# Patient Record
Sex: Female | Born: 1950 | Race: White | Hispanic: No | Smoking: Former smoker
Health system: Southern US, Community
[De-identification: ages and names within clinical notes are randomized; demographics above are authoritative.]

## PROBLEM LIST (undated history)

## (undated) DIAGNOSIS — F419 Anxiety disorder, unspecified: Secondary | ICD-10-CM

## (undated) DIAGNOSIS — M1990 Unspecified osteoarthritis, unspecified site: Secondary | ICD-10-CM

## (undated) DIAGNOSIS — R51 Headache: Secondary | ICD-10-CM

## (undated) DIAGNOSIS — K219 Gastro-esophageal reflux disease without esophagitis: Secondary | ICD-10-CM

## (undated) DIAGNOSIS — D649 Anemia, unspecified: Secondary | ICD-10-CM

## (undated) DIAGNOSIS — Z8719 Personal history of other diseases of the digestive system: Secondary | ICD-10-CM

## (undated) DIAGNOSIS — J40 Bronchitis, not specified as acute or chronic: Secondary | ICD-10-CM

## (undated) DIAGNOSIS — R32 Unspecified urinary incontinence: Secondary | ICD-10-CM

## (undated) DIAGNOSIS — J189 Pneumonia, unspecified organism: Secondary | ICD-10-CM

## (undated) DIAGNOSIS — N189 Chronic kidney disease, unspecified: Secondary | ICD-10-CM

## (undated) HISTORY — PX: ABDOMINAL HYSTERECTOMY: SHX81

## (undated) HISTORY — PX: ANKLE SURGERY: SHX546

## (undated) HISTORY — PX: WRIST ARTHROSCOPY: SUR100

## (undated) HISTORY — PX: BUNIONECTOMY: SHX129

## (undated) HISTORY — PX: FRACTURE SURGERY: SHX138

## (undated) HISTORY — PX: TUBAL LIGATION: SHX77

## (undated) HISTORY — PX: HIP ARTHROPLASTY: SHX981

## (undated) HISTORY — PX: APPENDECTOMY: SHX54

---

## 1999-04-27 ENCOUNTER — Encounter: Payer: Self-pay | Admitting: Family Medicine

## 1999-04-27 ENCOUNTER — Encounter: Admission: RE | Admit: 1999-04-27 | Discharge: 1999-04-27 | Payer: Self-pay | Admitting: Family Medicine

## 2000-07-04 ENCOUNTER — Encounter: Payer: Self-pay | Admitting: Family Medicine

## 2000-07-04 ENCOUNTER — Encounter: Admission: RE | Admit: 2000-07-04 | Discharge: 2000-07-04 | Payer: Self-pay | Admitting: Family Medicine

## 2002-03-16 ENCOUNTER — Encounter: Payer: Self-pay | Admitting: Family Medicine

## 2002-03-16 ENCOUNTER — Encounter: Admission: RE | Admit: 2002-03-16 | Discharge: 2002-03-16 | Payer: Self-pay | Admitting: Family Medicine

## 2002-03-30 ENCOUNTER — Encounter: Payer: Self-pay | Admitting: Family Medicine

## 2002-03-30 ENCOUNTER — Encounter: Admission: RE | Admit: 2002-03-30 | Discharge: 2002-03-30 | Payer: Self-pay | Admitting: Family Medicine

## 2002-05-13 ENCOUNTER — Encounter: Payer: Self-pay | Admitting: Family Medicine

## 2002-05-13 ENCOUNTER — Encounter: Admission: RE | Admit: 2002-05-13 | Discharge: 2002-05-13 | Payer: Self-pay | Admitting: Family Medicine

## 2005-01-28 ENCOUNTER — Encounter: Admission: RE | Admit: 2005-01-28 | Discharge: 2005-01-28 | Payer: Self-pay | Admitting: Occupational Medicine

## 2005-12-26 ENCOUNTER — Encounter: Admission: RE | Admit: 2005-12-26 | Discharge: 2005-12-26 | Payer: Self-pay | Admitting: Family Medicine

## 2006-01-25 ENCOUNTER — Encounter: Admission: RE | Admit: 2006-01-25 | Discharge: 2006-01-25 | Payer: Self-pay | Admitting: Orthopedic Surgery

## 2006-04-14 ENCOUNTER — Inpatient Hospital Stay (HOSPITAL_COMMUNITY): Admission: EM | Admit: 2006-04-14 | Discharge: 2006-04-17 | Payer: Self-pay | Admitting: Emergency Medicine

## 2007-01-28 ENCOUNTER — Encounter: Admission: RE | Admit: 2007-01-28 | Discharge: 2007-01-28 | Payer: Self-pay | Admitting: Family Medicine

## 2007-02-13 ENCOUNTER — Inpatient Hospital Stay (HOSPITAL_COMMUNITY): Admission: AC | Admit: 2007-02-13 | Discharge: 2007-02-17 | Payer: Self-pay

## 2007-02-13 ENCOUNTER — Ambulatory Visit: Admission: RE | Admit: 2007-02-13 | Discharge: 2007-02-13 | Payer: Self-pay | Admitting: Orthopaedic Surgery

## 2007-02-16 ENCOUNTER — Ambulatory Visit: Payer: Self-pay | Admitting: Physical Medicine & Rehabilitation

## 2007-02-17 ENCOUNTER — Ambulatory Visit: Payer: Self-pay | Admitting: Physical Medicine & Rehabilitation

## 2007-02-17 ENCOUNTER — Inpatient Hospital Stay (HOSPITAL_COMMUNITY)
Admission: RE | Admit: 2007-02-17 | Discharge: 2007-02-27 | Payer: Self-pay | Admitting: Physical Medicine & Rehabilitation

## 2007-07-22 ENCOUNTER — Encounter: Admission: RE | Admit: 2007-07-22 | Discharge: 2007-07-22 | Payer: Self-pay | Admitting: Family Medicine

## 2007-10-17 IMAGING — CR DG CHEST 1V PORT
1 series · 1 of 1 positions shown · non-contrast
Comparison: 01/28/05.

CLINICAL DATA: Preop respiratory exam.  Right ankle fracture.
 PORTABLE CHEST - 1 VIEW, 04/14/06:

[view not recorded]
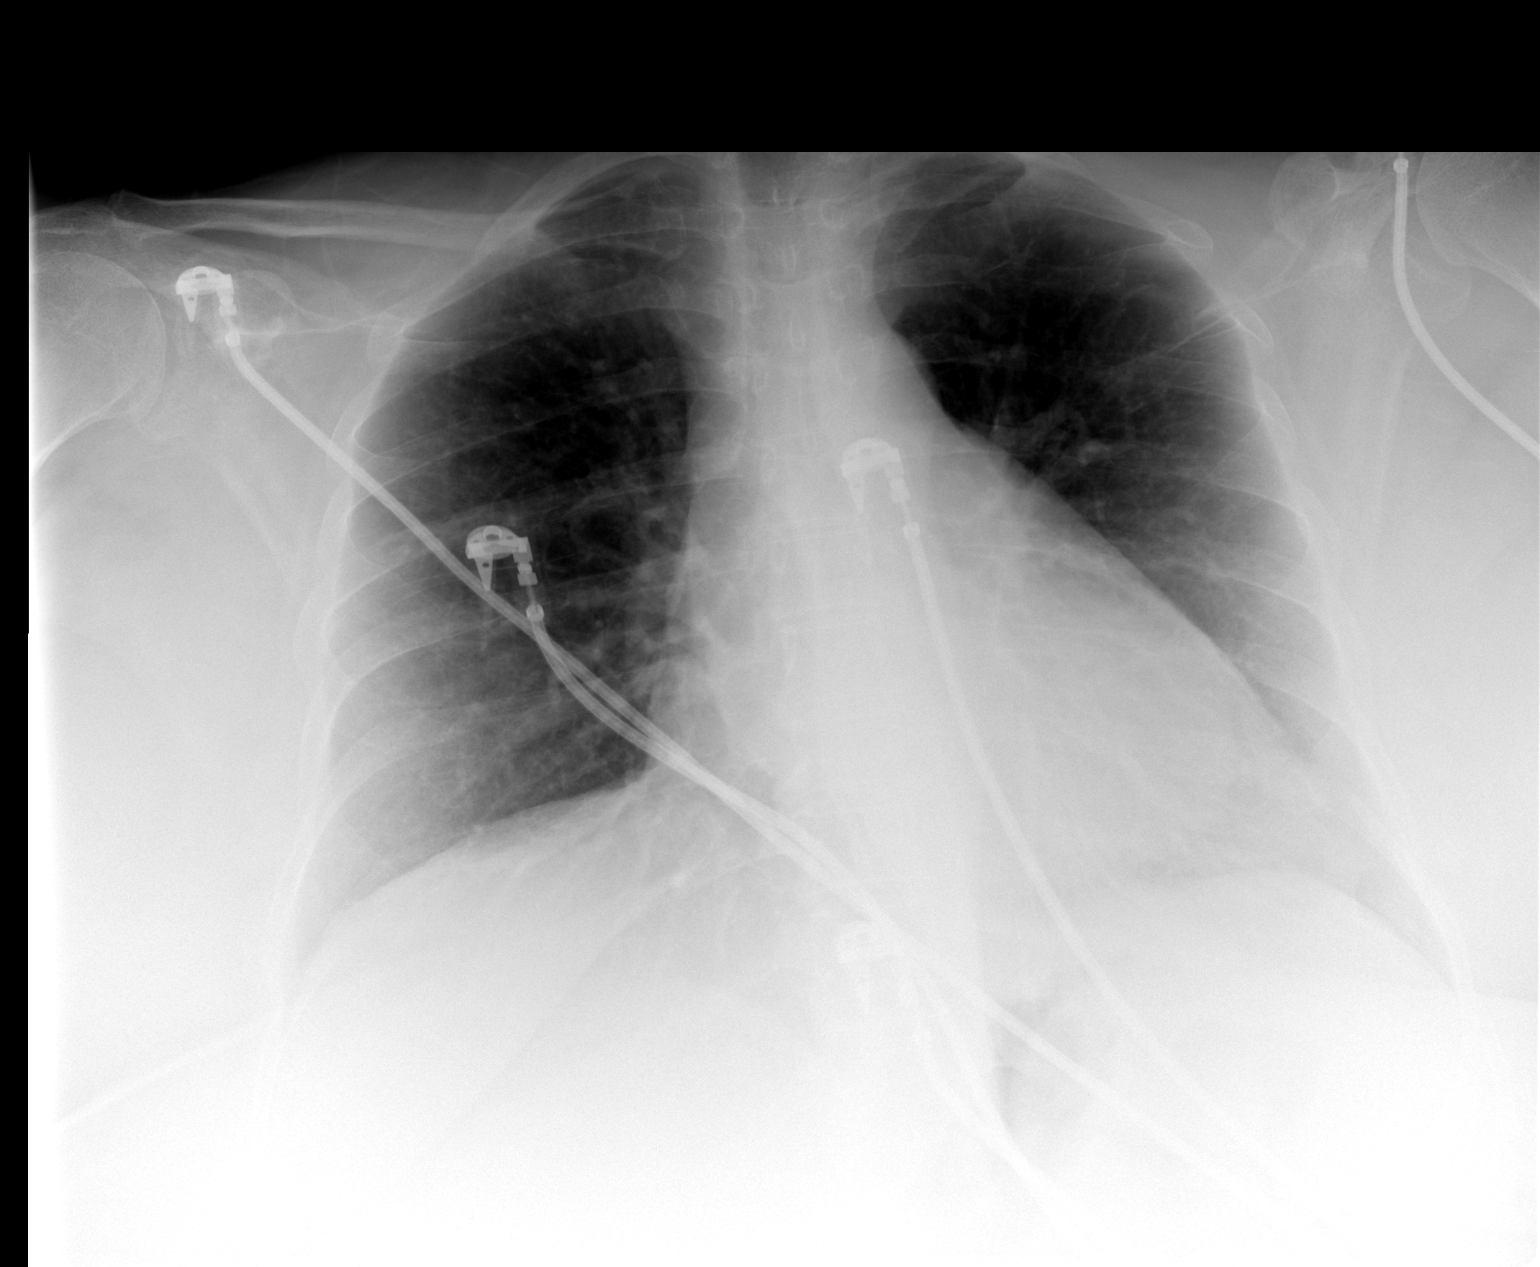

[1 of 1 positions shown; findings below may reference images not displayed]

FINDINGS: Heart size and vascularity are normal and the lungs are clear.  No bony abnormality.
IMPRESSION: Normal chest.

## 2007-10-17 IMAGING — RF DG ANKLE COMPLETE 3+V*R*
1 series · 3 of 3 positions shown · non-contrast
Comparison: Earlier the same day

RIGHT ANKLE - 2 VIEW:

CLINICAL DATA: Right ankle fracture

[Series 1: run · 3 of 3 slices shown]
[im 1/3]
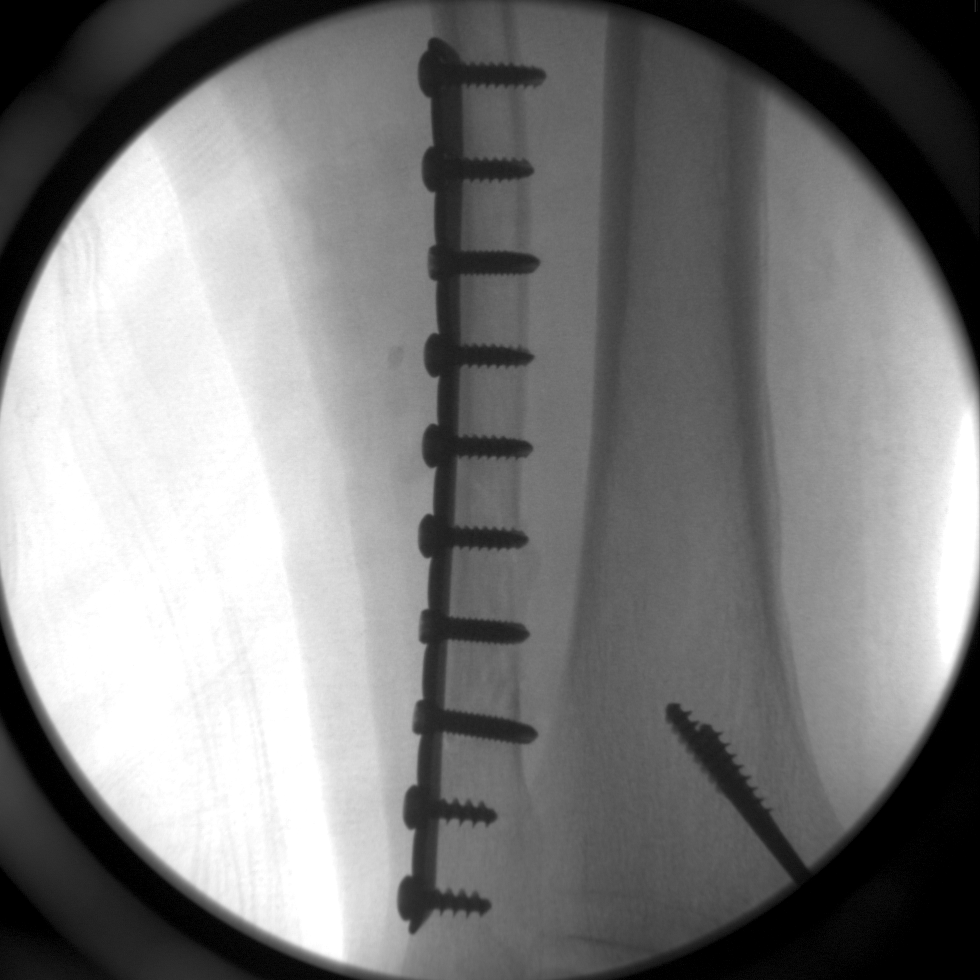
[im 2/3]
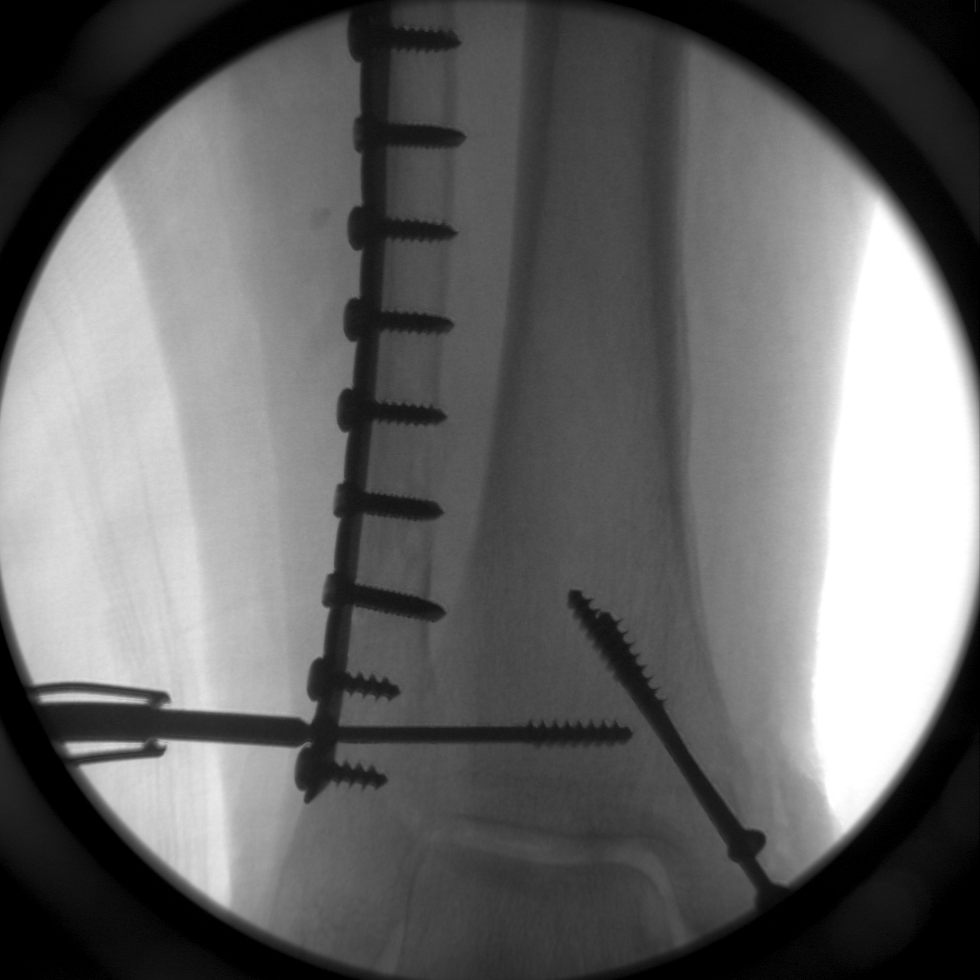
[im 3/3]
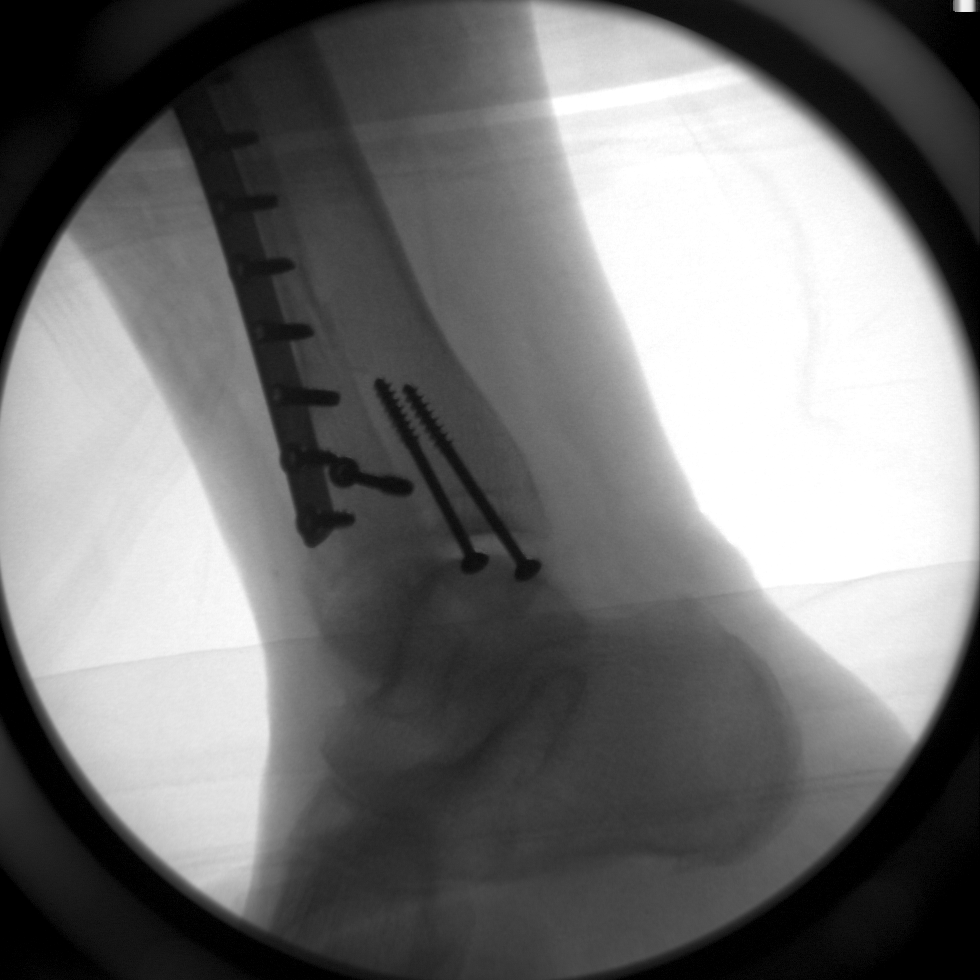

[3 of 3 positions shown; findings below may reference images not displayed]

FINDINGS: Three intraoperative spot Fluoro images are submitted after the
procedure. Patient has undergone compression plate and screw fixation for distal
fibula fracture with marked improvement in bony alignment. Two compression
screws transfix the medial malleolus fracture and a single syndesmotic
compression screw is noted. Alignment at the ankle mortise is substantially
improved compared to the prereduction study.
IMPRESSION: Intraoperative assessment during ORIF for trimalleolar ankle fracture.

## 2008-03-24 ENCOUNTER — Encounter: Admission: RE | Admit: 2008-03-24 | Discharge: 2008-03-24 | Payer: Self-pay | Admitting: Family Medicine
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
## 2008-08-31 ENCOUNTER — Inpatient Hospital Stay (HOSPITAL_COMMUNITY): Admission: RE | Admit: 2008-08-31 | Discharge: 2008-09-05 | Payer: Self-pay | Admitting: Orthopaedic Surgery

## 2008-10-20 ENCOUNTER — Encounter: Admission: RE | Admit: 2008-10-20 | Discharge: 2008-10-20 | Payer: Self-pay | Admitting: Family Medicine

## 2009-03-31 ENCOUNTER — Encounter: Admission: RE | Admit: 2009-03-31 | Discharge: 2009-03-31 | Payer: Self-pay | Admitting: Family Medicine

## 2010-02-09 ENCOUNTER — Encounter: Admission: RE | Admit: 2010-02-09 | Discharge: 2010-02-09 | Payer: Self-pay | Admitting: Orthopaedic Surgery

## 2010-07-24 LAB — URINALYSIS, ROUTINE W REFLEX MICROSCOPIC
Hgb urine dipstick: NEGATIVE
Ketones, ur: NEGATIVE mg/dL
Nitrite: NEGATIVE
Protein, ur: NEGATIVE mg/dL
Urobilinogen, UA: 1 mg/dL (ref 0.0–1.0)

## 2010-07-24 LAB — BASIC METABOLIC PANEL
BUN: 6 mg/dL (ref 6–23)
CO2: 27 mEq/L (ref 19–32)
Calcium: 8.1 mg/dL — ABNORMAL LOW (ref 8.4–10.5)
Creatinine, Ser: 0.7 mg/dL (ref 0.4–1.2)
GFR calc Af Amer: >60 mL/min (ref >60)
GFR calc non Af Amer: 36 mL/min — ABNORMAL LOW (ref >60)
Glucose, Bld: 138 mg/dL — ABNORMAL HIGH (ref 70–99)
Glucose, Bld: 147 mg/dL — ABNORMAL HIGH (ref 70–99)
Potassium: 4.9 mEq/L (ref 3.5–5.1)

## 2010-07-24 LAB — CBC
HCT: 26.7 % — ABNORMAL LOW (ref 36.0–46.0)
HCT: 35.1 % — ABNORMAL LOW (ref 36.0–46.0)
Hemoglobin: 11.9 g/dL — ABNORMAL LOW (ref 12.0–15.0)
Hemoglobin: 9.2 g/dL — ABNORMAL LOW (ref 12.0–15.0)
MCHC: 33.9 g/dL (ref 30.0–36.0)
MCV: 88.2 fL (ref 78.0–100.0)
Platelets: 176 10*3/uL (ref 150–400)
Platelets: 242 10*3/uL (ref 150–400)
RBC: 3.59 MIL/uL — ABNORMAL LOW (ref 3.87–5.11)
RDW: 14.1 % (ref 11.5–15.5)
WBC: 7.1 10*3/uL (ref 4.0–10.5)

## 2010-07-24 LAB — COMPREHENSIVE METABOLIC PANEL
Albumin: 3.6 g/dL (ref 3.5–5.2)
Alkaline Phosphatase: 91 U/L (ref 39–117)
BUN: 11 mg/dL (ref 6–23)
Calcium: 9.1 mg/dL (ref 8.4–10.5)
GFR calc Af Amer: >60 mL/min (ref >60)
GFR calc non Af Amer: >60 mL/min (ref >60)
Sodium: 143 mEq/L (ref 135–145)
Total Protein: 6.6 g/dL (ref 6.0–8.3)

## 2010-07-24 LAB — PROTIME-INR
INR: 0.9 (ref 0.00–1.49)
INR: 1.1 (ref 0.00–1.49)
INR: 1.4 (ref 0.00–1.49)
INR: 2 — ABNORMAL HIGH (ref 0.00–1.49)
INR: 2 — ABNORMAL HIGH (ref 0.00–1.49)
Prothrombin Time: 17.7 seconds — ABNORMAL HIGH (ref 11.6–15.2)
Prothrombin Time: 24.3 seconds — ABNORMAL HIGH (ref 11.6–15.2)

## 2010-07-24 LAB — DIFFERENTIAL
Eosinophils Relative: 6 % — ABNORMAL HIGH (ref 0–5)
Lymphs Abs: 1.8 10*3/uL (ref 0.7–4.0)
Neutrophils Relative %: 59 % (ref 43–77)

## 2010-07-24 LAB — URINE MICROSCOPIC-ADD ON
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
## 2010-08-28 NOTE — Op Note (Signed)
[name], [name]               ACCOUNT NO.:  [id number]   MEDICAL RECORD NO.:  [id number]          PATIENT TYPE:  INP   LOCATION:  5036                         FACILITY:  [facility]   PHYSICIAN:  Mark C. Ophelia Charter, M.D.    DATE OF BIRTH:  03/17/1951   DATE OF PROCEDURE:  08/31/2008  DATE OF DISCHARGE:                               OPERATIVE REPORT   PREOPERATIVE DIAGNOSES:  Right hip osteoarthritis.   POSTOPERATIVE DIAGNOSIS:  Right hip osteoarthritis.   PROCEDURE:  Right total hip arthroplasty.   SURGEON:  Mark C. Ophelia Charter, MD   ASSISTANT:  Maud Deed, PA-C   ANESTHESIA:  GOT plus Marcaine skin local.   DRAINS:  None.   This 60 year old female has had progressive hip osteoarthritis, good  response to single intra-articular injection under fluoro with pain  relief.  She has loss of joint space, flattening of head, large marginal  osteophytes, and subchondral cyst formation.  Leg lengths were equal.  She has failed conservative treatment and has progressed to the point  where she is ambulating with a cane and a walker and has pain with every  step.   After induction of general anesthesia, orotracheal intubation, the  patient was placed in the lateral position.  After Foley placement and  hip was prepped, 2 g of Ancef was given prophylactically.  ChloraPrep  was used due to the patient's questionable IODINE ALLERGY.  Once it was  dry, Betadine Vi-Drape was applied.  This was decided with the patient's  borderline skin allergy and the fact that the decreased bacterial count  with the drape decrease the patient's infection right.  At the end of  the case, all sterile drapes was completely removed from the patient.   Usual impervious stockinette, Coban, and split sheets drapes sticky U  drape, impervious and regular U drapes were applied with Betadine Vi-  drapes in the skin, after sterile skin marker was used.  Posterior  approach was made and slow dissection was performed using  the Bovie  electrocautery going through the thick layer of adipose tissue, the  patient was 300+ pounds and most of that was in the region of the  incision and dissection.  Once the fascia was finally encountered, the  Charnley with extra deep blade had to be used just in order to visualize  the fascia.  Fascia was opened.  Gluteus maximus was split in line with  its fibers.  The Charnley was able to just barely catch the gluteus  medius muscle.  Piriformis was tagged and cut.  Nerve was palpated,  posterior capsule was opened, osteophytes were removed off the  acetabular side after the neck was cut with an oscillating saw 1  fingerbreadth above the lesser trochanter.  Canal started reamer,  sequential broaching reaming was performed up to #5 with excellent fill  and fit with a #5.  Prosthesis was still proud by about 2 mm that gave  extremely tight fit anterior-posterior.  There was concern that full  seating might crack the calcar in this very large woman.  Marney Doctor was  removed.  Canal was  irrigated,  a 4 x 18 sponge was placed down the  canal after irrigation, sharp Hohmann anterior and sharp Cobra inferior-  posterior on the acetabulum, labrum was completely removed.  Anterior  osteophytes were removed.  Sequential reaming, which was difficult with  the neck partially in the way.  Once the anterior osteophytes were  removed, the anterior capsule was looser allowing the femur to retract  better out of the way, sequential reaming up to 52 cup planned.  The 51  reamer was last reamer, this was reamed down to the base of the fovea.  Cup was set with corner of the room in 45 degrees abduction selected.  There was slight lateral overhanging after removal of the osteophytes,  posterior portion was flushed and there was 6-7 mm noncoverage  anteriorly.  About 45 degrees of abduction and 25 degrees of anteversion  were placed.  After trials permanent cup was inserted, central hole  filled and  the two-piece all metal cup liner was inserted, DePuy metal-  on-metal.  Final #5 porous-coated stem was placed followed by the +1.5  36-mm ball, high offset neck was selected and there was difficulty  getting the hip reduced.  There was no shuck, no tightness of the quad,  flexion 90 degrees, internal rotation 85 degrees with no dislocation.  No impingement anteriorly.  No impingement with the trochanter with  abduction.  After irrigation with saline solution, piriformis repaired  back to gluteus medius, which was tight with the high offset.  Tensor  fascia was tight and was repaired side-to-side with interrupted #1  Ethibond or Tycron sutures.  0-Vicryl in fatty layers followed by 2-0  Vicryl in fatty layers, 2 on the subcutaneous tissue, and then  subcuticular skin closure.  Tincture of Benzoin, Steri-Strips were  applied.  Postop dressing, knee immobilizer and transferred to recovery  room in stable condition.  Instrument count and needle count was  correct.        Mark C. Ophelia Charter, M.D.  Electronically Signed     MCY/MEDQ  D:  08/31/2008  T:  09/01/2008  Job:  [id number]
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
## 2010-08-28 NOTE — Discharge Summary (Signed)
[name], [name]               ACCOUNT NO.:  [id number]   MEDICAL RECORD NO.:  [id number]          PATIENT TYPE:  INP   LOCATION:  5036                         FACILITY:  [facility]   PHYSICIAN:  Mark C. Ophelia Charter, M.D.    DATE OF BIRTH:  1951/02/05   DATE OF ADMISSION:  08/31/2008  DATE OF DISCHARGE:  09/05/2008                               DISCHARGE SUMMARY   ADMISSION DIAGNOSES:  1. Osteoarthritis of the right hip.  2. Depression.  3. Gastroesophageal reflux disease.  4. Asthma.  5. Peripheral edema.  6. Hypertension.   DISCHARGE DIAGNOSES:  1. Osteoarthritis of the right hip.  2. Depression.  3. Gastroesophageal reflux disease.  4. Asthma.  5. Peripheral edema.  6. Hypertension.  7. Posthemorrhagic anemia.  8. Postoperative hypovolemia, resolved.  9. Obesity.   PROCEDURE:  On Aug 31, 2008, the patient underwent right total hip  arthroplasty performed by Dr. Ophelia Charter, assisted by Maud Deed, PA-C  under general anesthesia.   CONSULTATIONS:  None.   BRIEF HISTORY:  The patient is a 60 year old female with progressive  pain in the right hip due to osteoarthritis.  She has undergone intra-  articular injection to the right hip, which gave her relief of her pain,  but this was only temporary.  Radiograph show loss of joint space  flattening of the femoral head, large marginal osteophytes and  subchondral cyst formation.  It was felt that she would benefit from  surgical intervention and was admitted for the procedure as stated  above.   BRIEF HOSPITAL COURSE:  The patient tolerated the procedure under  general anesthesia without complications.  Postoperatively,  neurovascular motor function of the lower extremities was intact.  Dressing changes were done daily and wound was healing without drainage  or signs of infection through the hospital stay.  The patient did have  hypovolemia on the first postoperative day with oxygen saturations into  the 60s.  She required fluid  boluses, which brought her oxygen  saturations and vital signs back to normal value.  She was started on  physical therapy for ambulation and gait training.  She was instructed  in total hip replacement precautions and was able to demonstrate  adherence to these precautions prior to discharge.  The patient was  treated initially with PCA analgesics and was weaned to p.o. analgesics  without difficulty.  She was started on Coumadin for DVT prophylaxis.  Adjustments in Coumadin dose were made according to daily Protimes by  the pharmacist at the hospital.  With physical therapy, the patient was  able to ambulate as much as 150 feet.  She was independent with  activities of daily living.  She was discharged to her home on Sep 05, 2008.   PERTINENT LABORATORY VALUES:  Admission CBC within normal limits.  Hemoglobin and hematocrit 11.9 and 35.1.  Postoperatively, hemoglobin  and hematocrit dropped to 9.2 and 26.7.  On admission, coagulation  studies within normal limits.  INR at discharge 2.0.  Chemistry studies  on admission within normal limits with exception of glucose 107.  Postoperatively, sodium dropped to 134,  but returned to normal value on  recheck.  Calcium 9.1 on admission and 8.1 postoperatively.  Urinalysis  on admission moderate leukocyte esterase, many epithelials, 21-50 wbc's,  and 0-2 rbc's.  EKG on admission, normal sinus rhythm with no  significant change since last tracing.   PLAN:  The patient will be discharged to her home and receive home  health physical therapy and durable medical equipment.  She will change  her dressing daily or as needed.  She will be allowed to shower and the  wound may be wet if there is no drainage.  She will be weightbearing as  tolerated and use a walker for ambulation.  Ice packs as needed to the  operative extremity.  She will follow up with Dr. Ophelia Charter 2 weeks from the  date of surgery.   PRESCRIPTIONS AT DISCHARGE:  1. Robaxin 500 mg 1  every 6-8 hours as needed for spasm.  2. Tylox 5/325 one to two every 4-6 hours as needed for pain.  3. Coumadin 5 mg once daily or as directed.   She will resume her home medications as taken prior to admission with  the exception of Mobic and was given medication reconciliation form with  these instructions.   She was advised to call should she have questions or concerns prior to  return to office visit.   CONDITION ON DISCHARGE:  Stable.      Wende Neighbors, P.A.      Mark C. Ophelia Charter, M.D.  Electronically Signed    SMV/MEDQ  D:  10/13/2008  T:  10/13/2008  Job:  [id number]

## 2010-08-31 NOTE — Discharge Summary (Signed)
[name], [name]               ACCOUNT NO.:  [id number]   MEDICAL RECORD NO.:  [id number]          PATIENT TYPE:  INP   LOCATION:  5039                         FACILITY:  [facility]   PHYSICIAN:  Mark C. Ophelia Charter, M.D.    DATE OF BIRTH:  20-May-1950   DATE OF ADMISSION:  04/14/2006  DATE OF DISCHARGE:  04/17/2006                               DISCHARGE SUMMARY   FINAL DIAGNOSIS:  Right bimalleolar ankle fracture with stent osmotic  rupture (distal tib/fib ligament rupture).   PROCEDURE:  ORIF medial malleolus and fibular shaft was set with osmotic  screw placement on April 14, 2006.   This 60 year old female fell questionably on some ice, suffering a  trimalleolar ankle fracture with syndesmotic rupture.  He was walking to  his car on his way to work when he slipped and fell.   MEDICATIONS ON ADMISSION:  Include Nexium, Zyrtec, Xanax 0.25 mg p.o.  b.i.d. and iron 300 mg p.o. b.i.d.   X-rays demonstrated trimalleolar ankle fracture.  He was admitted and  taken to the operating room on December 31 at 9 p.m. and underwent  bimalleolar fixation with a fibular plating and syndesmotic screw  placement.  Tourniquet time 37 minutes.  Postoperatively, he was seen by  physical therapy, nonweightbearing.  Three in 1 commode was ordered.  He  was nonweightbearing secondary to syndesmotic screw.  He was placed on a  4-point walker, taking Tylox for pain and was discharged on April 17, 2006, due to slow progress, due to his weight greater than 300 pounds.  X-rays showed excellent position and alignment.  He was asked to  followup 1 week for a postoperative splint check.  Admission labs were  all normal with the exception of an AST of 40 with normal ALT, albumin  and bilirubin.  Glucose was mildly elevated at 117.  Asked to followup  in 1 week.      Mark C. Ophelia Charter, M.D.  Electronically Signed     MCY/MEDQ  D:  05/23/2006  T:  05/24/2006  Job:  [id number]
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
## 2010-10-03 ENCOUNTER — Other Ambulatory Visit: Payer: Self-pay | Admitting: Orthopaedic Surgery

## 2010-10-03 DIAGNOSIS — M79604 Pain in right leg: Secondary | ICD-10-CM

## 2010-10-04 ENCOUNTER — Ambulatory Visit
Admission: RE | Admit: 2010-10-04 | Discharge: 2010-10-04 | Disposition: A | Discharge status: Home / Self Care | Payer: BC Managed Care – PPO | Source: Ambulatory Visit | Attending: Orthopaedic Surgery | Admitting: Orthopaedic Surgery

## 2010-10-04 DIAGNOSIS — M79605 Pain in left leg: Secondary | ICD-10-CM

## 2011-01-22 LAB — CBC
HCT: 22.9 — ABNORMAL LOW
HCT: 23 — ABNORMAL LOW
HCT: 23.5 — ABNORMAL LOW
HCT: 26.9 — ABNORMAL LOW
Hemoglobin: 10.2 — ABNORMAL LOW
Hemoglobin: 7.5 — CL
Hemoglobin: 7.8 — CL
Hemoglobin: 8 — ABNORMAL LOW
MCHC: 32.9
MCHC: 33
MCHC: 33.1
MCHC: 33.6
MCV: 82.9
MCV: 83.3
Platelets: 217
Platelets: 273
Platelets: 309
RBC: 2.73 — ABNORMAL LOW
RBC: 2.92 — ABNORMAL LOW
RBC: 3.45 — ABNORMAL LOW
RBC: 3.5 — ABNORMAL LOW
RDW: 14.8 — ABNORMAL HIGH
RDW: 15 — ABNORMAL HIGH
RDW: 15.3 — ABNORMAL HIGH
RDW: 15.6 — ABNORMAL HIGH
WBC: 5.3
WBC: 6.6
WBC: 7.4
WBC: 8.1

## 2011-01-22 LAB — COMPREHENSIVE METABOLIC PANEL
Albumin: 2.2 — ABNORMAL LOW
BUN: 5 — ABNORMAL LOW
Calcium: 8.4
Chloride: 97
Creatinine, Ser: 0.58
Total Bilirubin: 1.3 — ABNORMAL HIGH

## 2011-01-22 LAB — URINALYSIS, ROUTINE W REFLEX MICROSCOPIC
Glucose, UA: NEGATIVE
Glucose, UA: NEGATIVE
Ketones, ur: NEGATIVE
Protein, ur: NEGATIVE
Specific Gravity, Urine: 1.021
Urobilinogen, UA: 2 — ABNORMAL HIGH
pH: 6

## 2011-01-22 LAB — BASIC METABOLIC PANEL
BUN: 9
CO2: 28
Calcium: 8.9
Chloride: 103
Creatinine, Ser: 1.04
GFR calc Af Amer: >60
GFR calc non Af Amer: 54 — ABNORMAL LOW
Glucose, Bld: 157 — ABNORMAL HIGH
Potassium: 4.6
Sodium: 138

## 2011-01-22 LAB — DIFFERENTIAL
Basophils Absolute: 0
Lymphocytes Relative: 18
Monocytes Absolute: 0.5
Neutro Abs: 3.7

## 2011-01-22 LAB — URINE MICROSCOPIC-ADD ON

## 2011-01-22 LAB — CROSSMATCH: ABO/RH(D): O POS

## 2011-01-22 LAB — URINE CULTURE
Colony Count: >=100000
Special Requests: NEGATIVE

## 2011-01-23 LAB — CBC
HCT: 27.1 — ABNORMAL LOW
Hemoglobin: 9 — ABNORMAL LOW
MCV: 82.1
Platelets: 335
RDW: 14.6 — ABNORMAL HIGH

## 2011-01-23 LAB — I-STAT EC8
Acid-base deficit: 3 — ABNORMAL HIGH
Bicarbonate: 22.9
Glucose, Bld: 165 — ABNORMAL HIGH
TCO2: 24
pCO2 arterial: 42.4
pH, Arterial: 7.341 — ABNORMAL LOW

## 2011-01-23 LAB — POCT I-STAT 4, (NA,K, GLUC, HGB,HCT)
Glucose, Bld: 148 — ABNORMAL HIGH
Glucose, Bld: 168 — ABNORMAL HIGH
HCT: 25 — ABNORMAL LOW
Hemoglobin: 8.5 — ABNORMAL LOW
Operator id: 117071
Potassium: 4.7
Potassium: 5.1
Sodium: 140

## 2011-01-23 LAB — SAMPLE TO BLOOD BANK

## 2011-01-23 LAB — TYPE AND SCREEN: Antibody Screen: NEGATIVE

## 2011-01-23 LAB — POCT I-STAT CREATININE
Creatinine, Ser: 0.7
Operator id: 144801

## 2011-01-23 LAB — ABO/RH: ABO/RH(D): O POS

## 2011-03-15 ENCOUNTER — Other Ambulatory Visit (HOSPITAL_COMMUNITY): Payer: Self-pay | Admitting: Orthopaedic Surgery

## 2011-03-15 DIAGNOSIS — M25551 Pain in right hip: Secondary | ICD-10-CM

## 2011-03-27 ENCOUNTER — Encounter (HOSPITAL_COMMUNITY)
Admission: RE | Admit: 2011-03-27 | Discharge: 2011-03-27 | Disposition: A | Discharge status: Home / Self Care | Payer: BC Managed Care – PPO | Source: Ambulatory Visit | Attending: Orthopaedic Surgery | Admitting: Orthopaedic Surgery

## 2011-03-27 ENCOUNTER — Ambulatory Visit (HOSPITAL_COMMUNITY)
Admission: RE | Admit: 2011-03-27 | Discharge: 2011-03-27 | Disposition: A | Discharge status: Home / Self Care | Payer: BC Managed Care – PPO | Source: Ambulatory Visit | Attending: Orthopaedic Surgery | Admitting: Orthopaedic Surgery

## 2011-03-27 DIAGNOSIS — M25559 Pain in unspecified hip: Secondary | ICD-10-CM | POA: Insufficient documentation

## 2011-03-27 DIAGNOSIS — M25551 Pain in right hip: Secondary | ICD-10-CM

## 2011-03-27 DIAGNOSIS — Z96649 Presence of unspecified artificial hip joint: Secondary | ICD-10-CM | POA: Insufficient documentation

## 2011-03-27 MED ORDER — TECHNETIUM TC 99M MEDRONATE IV KIT
25.0000 | PACK | Freq: Once | INTRAVENOUS | Status: AC | PRN
  Administered 2011-03-27: 25 via INTRAVENOUS

## 2011-04-23 ENCOUNTER — Other Ambulatory Visit (HOSPITAL_COMMUNITY): Payer: Self-pay | Admitting: Orthopedic Surgery

## 2011-04-25 ENCOUNTER — Encounter (HOSPITAL_COMMUNITY)
Admission: RE | Admit: 2011-04-25 | Discharge: 2011-04-25 | Disposition: A | Discharge status: Home / Self Care | Payer: BC Managed Care – PPO | Source: Ambulatory Visit | Attending: Orthopaedic Surgery | Admitting: Orthopaedic Surgery

## 2011-04-25 ENCOUNTER — Encounter (HOSPITAL_COMMUNITY): Payer: Self-pay | Admitting: Respiratory Therapy

## 2011-04-25 ENCOUNTER — Ambulatory Visit (HOSPITAL_COMMUNITY)
Admission: RE | Admit: 2011-04-25 | Discharge: 2011-04-25 | Disposition: A | Discharge status: Home / Self Care | Payer: BC Managed Care – PPO | Source: Ambulatory Visit | Attending: Orthopaedic Surgery | Admitting: Orthopaedic Surgery

## 2011-04-25 ENCOUNTER — Encounter (HOSPITAL_COMMUNITY): Payer: Self-pay

## 2011-04-25 ENCOUNTER — Other Ambulatory Visit: Payer: Self-pay

## 2011-04-25 HISTORY — DX: Headache: R51

## 2011-04-25 HISTORY — DX: Bronchitis, not specified as acute or chronic: J40

## 2011-04-25 HISTORY — DX: Unspecified urinary incontinence: R32

## 2011-04-25 HISTORY — DX: Anxiety disorder, unspecified: F41.9

## 2011-04-25 HISTORY — DX: Anemia, unspecified: D64.9

## 2011-04-25 HISTORY — DX: Pneumonia, unspecified organism: J18.9

## 2011-04-25 HISTORY — DX: Gastro-esophageal reflux disease without esophagitis: K21.9

## 2011-04-25 HISTORY — DX: Unspecified osteoarthritis, unspecified site: M19.90

## 2011-04-25 HISTORY — DX: Personal history of other diseases of the digestive system: Z87.19

## 2011-04-25 HISTORY — DX: Chronic kidney disease, unspecified: N18.9

## 2011-04-25 LAB — URINALYSIS, ROUTINE W REFLEX MICROSCOPIC
Ketones, ur: NEGATIVE mg/dL
Nitrite: POSITIVE — AB
Protein, ur: NEGATIVE mg/dL
pH: 5.5 (ref 5.0–8.0)

## 2011-04-25 LAB — COMPREHENSIVE METABOLIC PANEL
Albumin: 4.2 g/dL (ref 3.5–5.2)
BUN: 15 mg/dL (ref 6–23)
Chloride: 96 mEq/L (ref 96–112)
Creatinine, Ser: 0.9 mg/dL (ref 0.50–1.10)
Total Bilirubin: 0.5 mg/dL (ref 0.3–1.2)
Total Protein: 7.7 g/dL (ref 6.0–8.3)

## 2011-04-25 LAB — CBC
HCT: 42.1 % (ref 36.0–46.0)
MCH: 30.7 pg (ref 26.0–34.0)
MCHC: 33.3 g/dL (ref 30.0–36.0)
MCV: 92.3 fL (ref 78.0–100.0)
RDW: 13.2 % (ref 11.5–15.5)

## 2011-04-25 LAB — PROTIME-INR: INR: 0.95 (ref 0.00–1.49)

## 2011-04-25 LAB — URINE MICROSCOPIC-ADD ON

## 2011-04-25 LAB — DIFFERENTIAL
Basophils Relative: 0 % (ref 0–1)
Monocytes Relative: 7 % (ref 3–12)
Neutro Abs: 8.1 10*3/uL — ABNORMAL HIGH (ref 1.7–7.7)
Neutrophils Relative %: 70 % (ref 43–77)

## 2011-04-25 LAB — ABO/RH: ABO/RH(D): O POS

## 2011-04-25 LAB — TYPE AND SCREEN: ABO/RH(D): O POS

## 2011-04-25 MED ORDER — CEFAZOLIN SODIUM-DEXTROSE 2-3 GM-% IV SOLR
2.0000 g | INTRAVENOUS | Status: AC
  Administered 2011-04-26: 2 g via INTRAVENOUS
  Filled 2011-04-25: qty 50

## 2011-04-25 NOTE — H&P (Signed)
[name] is an 61 y.o. female.   [name] is an 61 y.o. female.   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  She has had increasing pain to the right hip for one year, worse for the past 2 months.  She has had full evaluation including chromium and cobalt levels which are significantly elevated.. (cobalt 21.4 and chromium 4).  As of December 2012, sed rate 32 and CRP of 0.85.  A 3 phase bone scan on 03/27/11 showed no evidence of loosening of the hardware.  Venous doppler study in June 2012 was negative for DVT.  Aspiration of the hip was negative for infection.  It is felt she would benefit from revision of the hip replacement with conversion to a different weight bearing surface.  After risk and benefits discussed with the patient she wishes to proceed with revision of the right THR with exchange of the metal liner to a polyethylene liner.  This will be done by Dr Ophelia Charter.   Past Medical History  Diagnosis Date  . Asthma   . Bronchitis   . Pneumonia   . Anemia   . Chronic kidney disease     urinary tract infection  . GERD (gastroesophageal reflux disease)   . H/O hiatal hernia   . Headache   . Arthritis     osteoarthritis  . Anxiety     xanax, 2 tabs per day  . Incontinence     wears pad    Past Surgical History  Procedure Date  . Appendectomy   . Tubal ligation   . Abdominal hysterectomy     partial  . Bunionectomy     right  . Ankle surgery     with plate on right and left  . Fracture surgery     left wrist fracture repair  with plate  . Hip arthroplasty     right    Family History  Problem Relation Age of Onset  . Anesthesia problems Mother   . Hypotension Father    Social History:  reports that she has quit smoking. Her smoking use included Cigarettes. She does not have any smokeless tobacco history on file. She reports that she drinks alcohol. She reports that she does not  use illicit drugs.  Allergies: No Known Allergies  No current facility-administered medications on file as of .   No current outpatient prescriptions on file as of .    Results for orders placed during the hospital encounter of 04/25/11 (from the past 48 hour(s))  CBC     Status: Abnormal   Collection Time   04/25/11  3:56 PM      Component Value Range Comment   WBC 11.6 (*) 4.0 - 10.5 (K/uL)    RBC 4.56  3.87 - 5.11 (MIL/uL)    Hemoglobin 14.0  12.0 - 15.0 (g/dL)    HCT 08.6  57.8 - 46.9 (%)    MCV 92.3  78.0 - 100.0 (fL)    MCH 30.7  26.0 - 34.0 (pg)    MCHC 33.3  30.0 - 36.0 (g/dL)    RDW 62.9  52.8 - 41.3 (%)    Platelets 313  150 - 400 (K/uL)   DIFFERENTIAL     Status: Abnormal   Collection Time   04/25/11  3:56 PM      Component Value Range Comment   Neutrophils Relative 70  43 -  77 (%)    Neutro Abs 8.1 (*) 1.7 - 7.7 (K/uL)    Lymphocytes Relative 19  12 - 46 (%)    Lymphs Abs 2.2  0.7 - 4.0 (K/uL)    Monocytes Relative 7  3 - 12 (%)    Monocytes Absolute 0.8  0.1 - 1.0 (K/uL)    Eosinophils Relative 4  0 - 5 (%)    Eosinophils Absolute 0.5  0.0 - 0.7 (K/uL)    Basophils Relative 0  0 - 1 (%)    Basophils Absolute 0.0  0.0 - 0.1 (K/uL)    Dg Chest 2 View  04/25/2011  *RADIOLOGY REPORT*  Clinical Data: Preoperative respiratory examination for revision of right total hip arthroplasty.  CHEST - 2 VIEW  Comparison: 08/29/2008 radiographs.  Findings: Detail is limited by body habitus.  Heart size and mediastinal contours are stable.  The lungs are clear.  There is no pleural effusion or pneumothorax.  No acute osseous findings are identified.  IMPRESSION: Stable examination.  No active cardiopulmonary process demonstrated.  Original Report Authenticated By: Gerrianne Scale, M.D.    Review of Systems  Constitutional: Negative.   HENT: Negative.   Eyes: Negative.   Respiratory: Negative.   Cardiovascular: Negative.   Gastrointestinal: Negative.   Genitourinary:  Negative.   Musculoskeletal:       See hpi  Skin: Negative.   Neurological: Negative.   Endo/Heme/Allergies: Negative.     There were no vitals taken for this visit. Physical Exam  Constitutional: She is oriented to person, place, and time. She appears well-developed and well-nourished.  HENT:  Head: Normocephalic and atraumatic.  Eyes: EOM are normal.  Neck: Normal range of motion.  Cardiovascular: Normal rate and regular rhythm.   Respiratory: Effort normal and breath sounds normal.  GI: Soft.  Musculoskeletal:       Well healed surgical wound of hip.  NV and motor distal LE intact  Neurological: She is oriented to person, place, and time.  Skin: Skin is warm and dry.  Psychiatric: She has a normal mood and affect.     Assessment/Plan Painful right total hip replacement  PLAN  Right total hip revision.  VERNON,SHEILA M 04/25/2011, 4:54 PM   Pt seen and examined. H and P undated. Procedure and planned discussed, risks discussed, all questions answered.

## 2011-04-25 NOTE — Progress Notes (Signed)
Forwarded EKG to anesthesia for review.  

## 2011-04-25 NOTE — Pre-Procedure Instructions (Signed)
20 [name]  04/25/2011   Your procedure is scheduled on:  Friday April 26, 2011  Report to [facility] at 0530 AM.  Call this number if you have problems the morning of surgery: [phone]   Remember:   Do not eat food:After Midnight.  May have clear liquids: up to 4 Hours before arrival. (up to 1:30am)  Clear liquids include soda, tea, black coffee, apple or grape juice, broth.  Take these medicines the morning of surgery with A SIP OF WATER: xanax, nexium,   Do not wear jewelry, make-up or nail polish.  Do not wear lotions, powders, or perfumes. You may wear deodorant.  Do not shave 48 hours prior to surgery.  Do not bring valuables to the hospital.  Contacts, dentures or bridgework may not be worn into surgery.  Leave suitcase in the car. After surgery it may be brought to your room.  For patients admitted to the hospital, checkout time is 11:00 AM the day of discharge.   Patients discharged the day of surgery will not be allowed to drive home.  Name and phone number of your driver: Marielys Trinidad [phone]  Special Instructions: CHG Shower Use Special Wash: 1/2 bottle night before surgery and 1/2 bottle morning of surgery.   Please read over the following fact sheets that you were given: Pain Booklet, Coughing and Deep Breathing, MRSA Information and Surgical Site Infection Prevention

## 2011-04-25 NOTE — H&P (Deleted)
[name] is an 61 y.o. female.   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  She has had increasing pain to the right hip for one year, worse for the past 2 months.  She has had full evaluation including chromium and cobalt levels which are significantly elevated.. (cobalt 21.4 and chromium 4).  As of December 2012, sed rate 32 and CRP of 0.85.  A 3 phase bone scan on 03/27/11 showed no evidence of loosening of the hardware.  Venous doppler study in June 2012 was negative for DVT.  Aspiration of the hip was negative for infection.  It is felt she would benefit from revision of the hip replacement with conversion to a different weight bearing surface.  After risk and benefits discussed with the patient she wishes to proceed with revision of the right THR with exchange of the metal liner to a polyethylene liner.  This will be done by Dr Ophelia Charter.  No past medical history on file.  No past surgical history on file.  No family history on file. Social History:  does not have a smoking history on file. She does not have any smokeless tobacco history on file. Her alcohol and drug histories not on file.  Allergies: Allergies not on file  No current facility-administered medications on file as of .   No current outpatient prescriptions on file as of .    No results found for this or any previous visit (from the past 48 hour(s)). No results found.  Review of Systems  Constitutional: Negative.   HENT: Negative.   Eyes: Negative.   Respiratory: Negative.   Cardiovascular: Negative.   Gastrointestinal: Negative.   Genitourinary: Negative.   Musculoskeletal:       Right hip pain  Skin: Negative.   Neurological: Negative.   Endo/Heme/Allergies: Negative.   Psychiatric/Behavioral: Negative.     There were no vitals taken for this visit. Physical Exam  Constitutional: She is oriented to person, place, and time.  She appears well-developed and well-nourished.  HENT:  Head: Normocephalic and atraumatic.  Eyes: EOM are normal. Pupils are equal, round, and reactive to light.  Neck: Normal range of motion. Neck supple.  Cardiovascular: Normal rate and regular rhythm.   Respiratory: Effort normal and breath sounds normal.  GI: Soft.  Musculoskeletal:       Well healed incision of the right hip.  Distal lower extremities with NV and motor intact  Neurological: She is alert and oriented to person, place, and time.  Skin: Skin is warm and dry.  Psychiatric: She has a normal mood and affect.     Assessment/Plan Painful right total hip replacement  PLAN  Right total hip revision.  [name] M 04/25/2011, 1:55 PM

## 2011-04-26 ENCOUNTER — Ambulatory Visit (HOSPITAL_COMMUNITY): Payer: BC Managed Care – PPO

## 2011-04-26 ENCOUNTER — Encounter (HOSPITAL_COMMUNITY): Payer: Self-pay | Admitting: Anesthesiology

## 2011-04-26 ENCOUNTER — Inpatient Hospital Stay (HOSPITAL_COMMUNITY)
Admission: RE | Admit: 2011-04-26 | Discharge: 2011-04-29 | DRG: 817 | Disposition: A | Discharge status: Home / Self Care | Payer: BC Managed Care – PPO | Source: Ambulatory Visit | Attending: Orthopaedic Surgery | Admitting: Orthopaedic Surgery

## 2011-04-26 ENCOUNTER — Other Ambulatory Visit: Payer: Self-pay | Admitting: Orthopaedic Surgery

## 2011-04-26 ENCOUNTER — Encounter (HOSPITAL_COMMUNITY)
Admission: RE | Disposition: A | Discharge status: Home / Self Care | Payer: Self-pay | Source: Ambulatory Visit | Attending: Orthopaedic Surgery

## 2011-04-26 ENCOUNTER — Ambulatory Visit (HOSPITAL_COMMUNITY): Payer: BC Managed Care – PPO | Admitting: Anesthesiology

## 2011-04-26 ENCOUNTER — Encounter (HOSPITAL_COMMUNITY): Payer: Self-pay | Admitting: Certified Registered"

## 2011-04-26 ENCOUNTER — Encounter (HOSPITAL_COMMUNITY): Payer: Self-pay | Admitting: Surgery

## 2011-04-26 DIAGNOSIS — T8489XA Other specified complication of internal orthopedic prosthetic devices, implants and grafts, initial encounter: Principal | ICD-10-CM | POA: Diagnosis present

## 2011-04-26 DIAGNOSIS — Y831 Surgical operation with implant of artificial internal device as the cause of abnormal reaction of the patient, or of later complication, without mention of misadventure at the time of the procedure: Secondary | ICD-10-CM | POA: Diagnosis present

## 2011-04-26 DIAGNOSIS — Z6841 Body Mass Index (BMI) 40.0 and over, adult: Secondary | ICD-10-CM

## 2011-04-26 DIAGNOSIS — F411 Generalized anxiety disorder: Secondary | ICD-10-CM | POA: Diagnosis present

## 2011-04-26 DIAGNOSIS — K219 Gastro-esophageal reflux disease without esophagitis: Secondary | ICD-10-CM | POA: Diagnosis present

## 2011-04-26 DIAGNOSIS — R7989 Other specified abnormal findings of blood chemistry: Secondary | ICD-10-CM | POA: Diagnosis present

## 2011-04-26 DIAGNOSIS — Z01818 Encounter for other preprocedural examination: Secondary | ICD-10-CM

## 2011-04-26 DIAGNOSIS — Z96649 Presence of unspecified artificial hip joint: Secondary | ICD-10-CM

## 2011-04-26 DIAGNOSIS — J45909 Unspecified asthma, uncomplicated: Secondary | ICD-10-CM | POA: Diagnosis present

## 2011-04-26 DIAGNOSIS — Z01812 Encounter for preprocedural laboratory examination: Secondary | ICD-10-CM

## 2011-04-26 DIAGNOSIS — Z96659 Presence of unspecified artificial knee joint: Secondary | ICD-10-CM

## 2011-04-26 DIAGNOSIS — Z01810 Encounter for preprocedural cardiovascular examination: Secondary | ICD-10-CM

## 2011-04-26 HISTORY — PX: TOTAL HIP REVISION: SHX763

## 2011-04-26 LAB — BODY FLUID CULTURE
Culture: NO GROWTH
Culture: NO GROWTH
Gram Stain: NONE SEEN
Gram Stain: NONE SEEN

## 2011-04-26 LAB — ANAEROBIC CULTURE
Gram Stain: NONE SEEN
Gram Stain: NONE SEEN

## 2011-04-26 LAB — GRAM STAIN: Gram Stain: NONE SEEN

## 2011-04-26 SURGERY — TOTAL HIP REVISION
Anesthesia: General | Site: Hip | Laterality: Right | Wound class: Clean

## 2011-04-26 MED ORDER — NEOSTIGMINE METHYLSULFATE 1 MG/ML IJ SOLN
INTRAMUSCULAR | Status: DC | PRN
  Administered 2011-04-26: 4 mg via INTRAVENOUS

## 2011-04-26 MED ORDER — PROMETHAZINE HCL 25 MG/ML IJ SOLN: 6.2500 mg | INTRAMUSCULAR | Status: DC | PRN

## 2011-04-26 MED ORDER — LACTATED RINGERS IV SOLN: INTRAVENOUS | Status: DC

## 2011-04-26 MED ORDER — WARFARIN VIDEO: Freq: Once | Status: DC

## 2011-04-26 MED ORDER — HYDROMORPHONE HCL PF 1 MG/ML IJ SOLN
0.2500 mg | INTRAMUSCULAR | Status: DC | PRN
  Administered 2011-04-26: 0.5 mg via INTRAVENOUS

## 2011-04-26 MED ORDER — CHLORHEXIDINE GLUCONATE 4 % EX LIQD: 60.0000 mL | Freq: Once | CUTANEOUS | Status: DC

## 2011-04-26 MED ORDER — METHOCARBAMOL 500 MG PO TABS
500.0000 mg | ORAL_TABLET | Freq: Four times a day (QID) | ORAL | Status: DC | PRN
  Administered 2011-04-27 – 2011-04-29 (×6): 500 mg via ORAL
  Filled 2011-04-26 (×6): qty 1

## 2011-04-26 MED ORDER — CEFAZOLIN SODIUM-DEXTROSE 2-3 GM-% IV SOLR
2.0000 g | Freq: Four times a day (QID) | INTRAVENOUS | Status: AC
  Administered 2011-04-26 – 2011-04-27 (×3): 2 g via INTRAVENOUS
  Filled 2011-04-26 (×3): qty 50

## 2011-04-26 MED ORDER — MORPHINE SULFATE (PF) 1 MG/ML IV SOLN
INTRAVENOUS | Status: DC
  Administered 2011-04-26: 1.5 mg via INTRAVENOUS
  Administered 2011-04-26: 3 mg via INTRAVENOUS
  Administered 2011-04-26: 4.5 mg via INTRAVENOUS
  Administered 2011-04-26: 11:00:00 via INTRAVENOUS
  Administered 2011-04-27: 0.1 mg via INTRAVENOUS
  Administered 2011-04-27: 1.5 mg via INTRAVENOUS

## 2011-04-26 MED ORDER — NALOXONE HCL 0.4 MG/ML IJ SOLN: 0.4000 mg | INTRAMUSCULAR | Status: DC | PRN

## 2011-04-26 MED ORDER — ONDANSETRON HCL 4 MG/2ML IJ SOLN
INTRAMUSCULAR | Status: DC | PRN
  Administered 2011-04-26: 4 mg via INTRAVENOUS

## 2011-04-26 MED ORDER — METOCLOPRAMIDE HCL 5 MG PO TABS
5.0000 mg | ORAL_TABLET | Freq: Three times a day (TID) | ORAL | Status: DC | PRN
  Filled 2011-04-26: qty 2

## 2011-04-26 MED ORDER — TORSEMIDE 20 MG PO TABS
20.0000 mg | ORAL_TABLET | Freq: Every day | ORAL | Status: DC
  Administered 2011-04-27 – 2011-04-29 (×3): 20 mg via ORAL
  Filled 2011-04-26 (×4): qty 1

## 2011-04-26 MED ORDER — SODIUM CHLORIDE 0.9 % IR SOLN
Status: DC | PRN
  Administered 2011-04-26: 3000 mL

## 2011-04-26 MED ORDER — PANTOPRAZOLE SODIUM 40 MG PO TBEC
40.0000 mg | DELAYED_RELEASE_TABLET | Freq: Every day | ORAL | Status: DC
  Administered 2011-04-26 – 2011-04-29 (×4): 40 mg via ORAL
  Filled 2011-04-26 (×4): qty 1

## 2011-04-26 MED ORDER — WARFARIN SODIUM 7.5 MG PO TABS
7.5000 mg | ORAL_TABLET | Freq: Once | ORAL | Status: AC
  Administered 2011-04-26: 7.5 mg via ORAL
  Filled 2011-04-26: qty 1

## 2011-04-26 MED ORDER — METHOCARBAMOL 100 MG/ML IJ SOLN
500.0000 mg | Freq: Four times a day (QID) | INTRAVENOUS | Status: DC | PRN
  Administered 2011-04-26: 500 mg via INTRAVENOUS
  Filled 2011-04-26: qty 5

## 2011-04-26 MED ORDER — ONDANSETRON HCL 4 MG/2ML IJ SOLN: 4.0000 mg | Freq: Four times a day (QID) | INTRAMUSCULAR | Status: DC | PRN

## 2011-04-26 MED ORDER — ROCURONIUM BROMIDE 100 MG/10ML IV SOLN
INTRAVENOUS | Status: DC | PRN
  Administered 2011-04-26 (×2): 10 mg via INTRAVENOUS
  Administered 2011-04-26: 50 mg via INTRAVENOUS

## 2011-04-26 MED ORDER — FERROUS SULFATE 325 (65 FE) MG PO TABS
325.0000 mg | ORAL_TABLET | Freq: Every day | ORAL | Status: DC
  Administered 2011-04-26 – 2011-04-29 (×4): 325 mg via ORAL
  Filled 2011-04-26 (×5): qty 1

## 2011-04-26 MED ORDER — HYDROCODONE-ACETAMINOPHEN 5-325 MG PO TABS
1.0000 | ORAL_TABLET | ORAL | Status: DC | PRN
  Administered 2011-04-27 – 2011-04-28 (×4): 2 via ORAL
  Filled 2011-04-26 (×4): qty 2

## 2011-04-26 MED ORDER — PHENOL 1.4 % MT LIQD
1.0000 | OROMUCOSAL | Status: DC | PRN
  Filled 2011-04-26: qty 177

## 2011-04-26 MED ORDER — HYDROCODONE-ACETAMINOPHEN 7.5-325 MG PO TABS: 1.0000 | ORAL_TABLET | ORAL | Status: DC | PRN

## 2011-04-26 MED ORDER — GLYCOPYRROLATE 0.2 MG/ML IJ SOLN
INTRAMUSCULAR | Status: DC | PRN
  Administered 2011-04-26: .3 mg via INTRAVENOUS

## 2011-04-26 MED ORDER — DIPHENHYDRAMINE HCL 12.5 MG/5ML PO ELIX
12.5000 mg | ORAL_SOLUTION | ORAL | Status: DC | PRN
  Administered 2011-04-29: 25 mg via ORAL
  Filled 2011-04-26: qty 10

## 2011-04-26 MED ORDER — PHENYLEPHRINE HCL 10 MG/ML IJ SOLN
INTRAMUSCULAR | Status: DC | PRN
  Administered 2011-04-26 (×4): 40 ug via INTRAVENOUS

## 2011-04-26 MED ORDER — DIPHENHYDRAMINE HCL 50 MG/ML IJ SOLN: 12.5000 mg | Freq: Four times a day (QID) | INTRAMUSCULAR | Status: DC | PRN

## 2011-04-26 MED ORDER — FENTANYL CITRATE 0.05 MG/ML IJ SOLN
INTRAMUSCULAR | Status: DC | PRN
  Administered 2011-04-26: 250 ug via INTRAVENOUS

## 2011-04-26 MED ORDER — ACETAMINOPHEN 325 MG PO TABS
650.0000 mg | ORAL_TABLET | Freq: Four times a day (QID) | ORAL | Status: DC | PRN
  Filled 2011-04-26: qty 2

## 2011-04-26 MED ORDER — MIDAZOLAM HCL 5 MG/5ML IJ SOLN
INTRAMUSCULAR | Status: DC | PRN
  Administered 2011-04-26: 1 mg via INTRAVENOUS

## 2011-04-26 MED ORDER — DIPHENHYDRAMINE HCL 12.5 MG/5ML PO ELIX
12.5000 mg | ORAL_SOLUTION | Freq: Four times a day (QID) | ORAL | Status: DC | PRN
  Filled 2011-04-26: qty 5

## 2011-04-26 MED ORDER — IRON 325 (65 FE) MG PO TABS: 325.0000 mg | ORAL_TABLET | Freq: Every day | ORAL | Status: DC

## 2011-04-26 MED ORDER — COUMADIN BOOK
Freq: Once | Status: AC
  Administered 2011-04-26: 17:00:00
  Filled 2011-04-26: qty 1

## 2011-04-26 MED ORDER — METOCLOPRAMIDE HCL 5 MG/ML IJ SOLN
5.0000 mg | Freq: Three times a day (TID) | INTRAMUSCULAR | Status: DC | PRN
  Filled 2011-04-26: qty 2

## 2011-04-26 MED ORDER — SODIUM CHLORIDE 0.9 % IJ SOLN: 9.0000 mL | INTRAMUSCULAR | Status: DC | PRN

## 2011-04-26 MED ORDER — LACTATED RINGERS IV SOLN
INTRAVENOUS | Status: DC | PRN
  Administered 2011-04-26 (×2): via INTRAVENOUS

## 2011-04-26 MED ORDER — MENTHOL 3 MG MT LOZG: 1.0000 | LOZENGE | OROMUCOSAL | Status: DC | PRN

## 2011-04-26 MED ORDER — PROPOFOL 10 MG/ML IV EMUL
INTRAVENOUS | Status: DC | PRN
  Administered 2011-04-26: 200 mg via INTRAVENOUS
  Administered 2011-04-26: 20 mg via INTRAVENOUS

## 2011-04-26 MED ORDER — ALPRAZOLAM 0.5 MG PO TABS
1.0000 mg | ORAL_TABLET | Freq: Every day | ORAL | Status: DC
  Administered 2011-04-26 – 2011-04-28 (×3): 1 mg via ORAL
  Filled 2011-04-26 (×3): qty 2

## 2011-04-26 MED ORDER — SIMVASTATIN 20 MG PO TABS
20.0000 mg | ORAL_TABLET | Freq: Every day | ORAL | Status: DC
  Administered 2011-04-26 – 2011-04-28 (×3): 20 mg via ORAL
  Filled 2011-04-26 (×4): qty 1

## 2011-04-26 MED ORDER — ALUM & MAG HYDROXIDE-SIMETH 200-200-20 MG/5ML PO SUSP: 30.0000 mL | ORAL | Status: DC | PRN

## 2011-04-26 MED ORDER — ACETAMINOPHEN 650 MG RE SUPP: 650.0000 mg | Freq: Four times a day (QID) | RECTAL | Status: DC | PRN

## 2011-04-26 MED ORDER — ZOLPIDEM TARTRATE 5 MG PO TABS: 5.0000 mg | ORAL_TABLET | Freq: Every evening | ORAL | Status: DC | PRN

## 2011-04-26 MED ORDER — KCL IN DEXTROSE-NACL 20-5-0.45 MEQ/L-%-% IV SOLN
INTRAVENOUS | Status: DC
  Administered 2011-04-26: 15:00:00 via INTRAVENOUS
  Filled 2011-04-26 (×2): qty 1000

## 2011-04-26 MED ORDER — ALPRAZOLAM ER 1 MG PO TB24: 1.0000 mg | ORAL_TABLET | Freq: Every evening | ORAL | Status: DC

## 2011-04-26 MED ORDER — ONDANSETRON HCL 4 MG PO TABS: 4.0000 mg | ORAL_TABLET | Freq: Four times a day (QID) | ORAL | Status: DC | PRN

## 2011-04-26 MED ORDER — OXYCODONE-ACETAMINOPHEN 5-325 MG PO TABS
1.0000 | ORAL_TABLET | ORAL | Status: DC | PRN
  Administered 2011-04-29: 1 via ORAL
  Filled 2011-04-26: qty 1

## 2011-04-26 SURGICAL SUPPLY — 64 items
BENZOIN TINCTURE PRP APPL 2/3 (GAUZE/BANDAGES/DRESSINGS) ×2 IMPLANT
BLADE SAW SGTL 13.0X1.19X90.0M (BLADE) ×2 IMPLANT
BLADE SURG ROTATE 9660 (MISCELLANEOUS) IMPLANT
BRUSH FEMORAL CANAL (MISCELLANEOUS) IMPLANT
CLOSURE STERI STRIP 1/2 X4 (GAUZE/BANDAGES/DRESSINGS) ×2 IMPLANT
CLOTH BEACON ORANGE TIMEOUT ST (SAFETY) ×2 IMPLANT
COVER BACK TABLE 24X17X13 BIG (DRAPES) IMPLANT
COVER SURGICAL LIGHT HANDLE (MISCELLANEOUS) ×2 IMPLANT
DRAPE C-ARM 42X72 X-RAY (DRAPES) IMPLANT
DRAPE INCISE IOBAN 66X45 STRL (DRAPES) IMPLANT
DRAPE ORTHO SPLIT 77X108 STRL (DRAPES) ×2
DRAPE SURG ORHT 6 SPLT 77X108 (DRAPES) ×2 IMPLANT
DRAPE U-SHAPE 47X51 STRL (DRAPES) ×2 IMPLANT
DRSG MEPILEX BORDER 4X12 (GAUZE/BANDAGES/DRESSINGS) ×2 IMPLANT
DRSG PAD ABDOMINAL 8X10 ST (GAUZE/BANDAGES/DRESSINGS) ×2 IMPLANT
DURAPREP 26ML APPLICATOR (WOUND CARE) ×2 IMPLANT
ELECT CAUTERY BLADE 6.4 (BLADE) ×2 IMPLANT
ELECT REM PT RETURN 9FT ADLT (ELECTROSURGICAL) ×2
ELECTRODE REM PT RTRN 9FT ADLT (ELECTROSURGICAL) ×1 IMPLANT
EVACUATOR 1/8 PVC DRAIN (DRAIN) IMPLANT
FACESHIELD LNG OPTICON STERILE (SAFETY) ×4 IMPLANT
GAUZE XEROFORM 5X9 LF (GAUZE/BANDAGES/DRESSINGS) ×2 IMPLANT
GLOVE BIOGEL PI IND STRL 7.5 (GLOVE) ×2 IMPLANT
GLOVE BIOGEL PI IND STRL 8 (GLOVE) ×2 IMPLANT
GLOVE BIOGEL PI INDICATOR 7.5 (GLOVE) ×2
GLOVE BIOGEL PI INDICATOR 8 (GLOVE) ×2
GLOVE ECLIPSE 7.0 STRL STRAW (GLOVE) ×4 IMPLANT
GLOVE ORTHO TXT STRL SZ7.5 (GLOVE) ×4 IMPLANT
GOWN PREVENTION PLUS LG XLONG (DISPOSABLE) ×4 IMPLANT
GOWN PREVENTION PLUS XLARGE (GOWN DISPOSABLE) IMPLANT
GOWN STRL NON-REIN LRG LVL3 (GOWN DISPOSABLE) ×4 IMPLANT
HANDPIECE INTERPULSE COAX TIP (DISPOSABLE) ×1
HEAD CERAMIC DELTA 36 PLUS 1.5 (Hips) ×2 IMPLANT
IMMOBILIZER KNEE 20 (SOFTGOODS)
IMMOBILIZER KNEE 20 THIGH 36 (SOFTGOODS) IMPLANT
IMMOBILIZER KNEE 22 UNIV (SOFTGOODS) IMPLANT
IMMOBILIZER KNEE 24 THIGH 36 (MISCELLANEOUS) IMPLANT
IMMOBILIZER KNEE 24 UNIV (MISCELLANEOUS)
KIT BASIN OR (CUSTOM PROCEDURE TRAY) ×2 IMPLANT
KIT ROOM TURNOVER OR (KITS) ×2 IMPLANT
MANIFOLD NEPTUNE II (INSTRUMENTS) ×2 IMPLANT
NEEDLE 1/2 CIR MAYO (NEEDLE) ×2 IMPLANT
NEEDLE HYPO 25GX1X1/2 BEV (NEEDLE) ×2 IMPLANT
NS IRRIG 1000ML POUR BTL (IV SOLUTION) ×2 IMPLANT
PACK TOTAL JOINT (CUSTOM PROCEDURE TRAY) ×2 IMPLANT
PAD ARMBOARD 7.5X6 YLW CONV (MISCELLANEOUS) ×4 IMPLANT
REAMER ROD DEEP FLUTE 2.5X950 (INSTRUMENTS) IMPLANT
SET HNDPC FAN SPRY TIP SCT (DISPOSABLE) ×1 IMPLANT
SPONGE GAUZE 4X4 12PLY (GAUZE/BANDAGES/DRESSINGS) IMPLANT
SPONGE LAP 4X18 X RAY DECT (DISPOSABLE) IMPLANT
STAPLER VISISTAT 35W (STAPLE) ×2 IMPLANT
SUCTION FRAZIER TIP 10 FR DISP (SUCTIONS) ×2 IMPLANT
SUT ETHIBOND NAB CT1 #1 30IN (SUTURE) ×4 IMPLANT
SUT TICRON (SUTURE) ×2 IMPLANT
SUT VIC AB 2-0 CT1 27 (SUTURE) ×1
SUT VIC AB 2-0 CT1 TAPERPNT 27 (SUTURE) ×1 IMPLANT
SUT VICRYL 0 TIES 12 18 (SUTURE) ×2 IMPLANT
SYR CONTROL 10ML LL (SYRINGE) ×2 IMPLANT
TAPE CLOTH SURG 6X10 WHT LF (GAUZE/BANDAGES/DRESSINGS) ×2 IMPLANT
TOWEL OR 17X24 6PK STRL BLUE (TOWEL DISPOSABLE) ×2 IMPLANT
TOWEL OR 17X26 10 PK STRL BLUE (TOWEL DISPOSABLE) ×2 IMPLANT
TOWER CARTRIDGE SMART MIX (DISPOSABLE) IMPLANT
TRAY FOLEY CATH 14FR (SET/KITS/TRAYS/PACK) ×2 IMPLANT
WATER STERILE IRR 1000ML POUR (IV SOLUTION) IMPLANT

## 2011-04-26 NOTE — Progress Notes (Addendum)
EKG noted to have abnormality-possible infarct.  Pt denies heart problems and any sx.  Dr Darien Ramus notified and stated no problem to continue w/surgery.  Note placed on chart.//L. Bhumi Godbey,RN

## 2011-04-26 NOTE — Anesthesia Postprocedure Evaluation (Signed)
  Anesthesia Post-op Note  Patient: [name]  Procedure(s) Performed:  TOTAL HIP REVISION - Revision Right Hip to Metal on Poly  Patient Location: PACU  Anesthesia Type: General  Level of Consciousness: awake, alert , oriented and sedated  Airway and Oxygen Therapy: Patient Spontanous Breathing and Patient connected to nasal cannula oxygen  Post-op Pain: mild  Post-op Assessment: Post-op Vital signs reviewed, Patient's Cardiovascular Status Stable, Respiratory Function Stable, Patent Airway, No signs of Nausea or vomiting and Pain level controlled  Post-op Vital Signs: Reviewed and stable  Complications: No apparent anesthesia complications

## 2011-04-26 NOTE — Transfer of Care (Signed)
Immediate Anesthesia Transfer of Care Note  Patient: [name]  Procedure(s) Performed:  TOTAL HIP REVISION - Revision Right Hip to Metal on Poly  Patient Location: PACU  Anesthesia Type: General  Level of Consciousness: awake  Airway & Oxygen Therapy: Patient Spontanous Breathing  Post-op Assessment: Report given to PACU RN  Post vital signs: stable Filed Vitals:   04/26/11 1024  BP:   Pulse:   Temp: 36.7 C  Resp:     Complications: No apparent anesthesia complications

## 2011-04-26 NOTE — Progress Notes (Signed)
ANTICOAGULATION CONSULT NOTE - Initial Consult  Pharmacy Consult for Coumadin  Indication: VTE prophylaxis  Allergies  Allergen Reactions  . Shellfish Allergy     Allergy testing showed being highly allergic to shellfish. Betadine used in past surgeries w/out problems.//L. Love,RN    Patient Measurements: Height: 5\' 6"  (167.6 cm) Weight: 314 lb (142.429 kg) IBW/kg (Calculated) : 59.3   Vital Signs: Temp: 98.6 F (37 C) (01/11 1451) Temp src: Oral (01/11 1451) BP: 84/45 mmHg (01/11 1451) Pulse Rate: 74  (01/11 1451)  Labs:  Basename 04/25/11 1556  HGB 14.0  HCT 42.1  PLT 313  APTT --  LABPROT 12.9  INR 0.95  HEPARINUNFRC --  CREATININE 0.90  CKTOTAL --  CKMB --  TROPONINI --   Estimated Creatinine Clearance: 94.6 ml/min (by C-G formula based on Cr of 0.9).  Medical History: Past Medical History  Diagnosis Date  . Asthma   . Bronchitis   . Pneumonia   . Anemia   . Chronic kidney disease     urinary tract infection  . GERD (gastroesophageal reflux disease)   . H/O hiatal hernia   . Headache   . Arthritis     osteoarthritis  . Anxiety     xanax, 2 tabs per day  . Incontinence     wears pad    Medications:  Prescriptions prior to admission  Medication Sig Dispense Refill  . ALPRAZolam (XANAX XR) 0.5 MG 24 hr tablet Take 1 mg by mouth every evening.      Marland Kitchen aspirin EC 81 MG tablet Take 81 mg by mouth daily.      . Calcium Carbonate-Vitamin D (CALCIUM 600 + D PO) Take 1 tablet by mouth daily.      Marland Kitchen esomeprazole (NEXIUM) 40 MG capsule Take 40 mg by mouth daily before breakfast.      . Ferrous Sulfate (IRON) 325 (65 FE) MG TABS Take 325 mg by mouth daily.      . pravastatin (PRAVACHOL) 40 MG tablet Take 40 mg by mouth daily.      Marland Kitchen torsemide (DEMADEX) 20 MG tablet Take 20 mg by mouth daily.       Scheduled:    . ALPRAZolam  1 mg Oral QPM  . ceFAZolin (ANCEF) IV  2 g Intravenous 60 min Pre-Op  . ceFAZolin (ANCEF) IV  2 g Intravenous Q6H  . morphine    Intravenous Q4H  . pantoprazole  40 mg Oral Q1200  . simvastatin  20 mg Oral q1800  . torsemide  20 mg Oral Daily  . DISCONTD: chlorhexidine  60 mL Topical Once  . DISCONTD: Iron  325 mg Oral Daily  . DISCONTD: Iron  325 mg Oral Q breakfast    Assessment: 61 y.o. Female s/p revision of right THR due to increasing pain to right hip. History of s/p R. THR 08/31/08. Baseline INR = 0.95 on 04/25/11.   Goal of Therapy:  INR 2-3   Plan:  Coumadin 7.5mg  po tonight x1.  Daily INR.  Arman Filter 04/26/2011,4:05 PM

## 2011-04-26 NOTE — Anesthesia Procedure Notes (Signed)
Procedure Name: Intubation Date/Time: 04/26/2011 7:35 AM Performed by: Ellin Goodie Pre-anesthesia Checklist: Patient identified, Emergency Drugs available, Suction available, Patient being monitored and Timeout performed Patient Re-evaluated:Patient Re-evaluated prior to inductionOxygen Delivery Method: Circle System Utilized Preoxygenation: Pre-oxygenation with 100% oxygen Intubation Type: IV induction Ventilation: Mask ventilation without difficulty Laryngoscope Size: Mac and 4 Grade View: Grade I Tube type: Oral Tube size: 7.5 mm Airway Equipment and Method: stylet Placement Confirmation: ETT inserted through vocal cords under direct vision,  positive ETCO2 and breath sounds checked- equal and bilateral Secured at: 22 cm Tube secured with: Tape Dental Injury: Teeth and Oropharynx as per pre-operative assessment

## 2011-04-26 NOTE — Anesthesia Preprocedure Evaluation (Addendum)
Anesthesia Evaluation  Patient identified by MRN, date of birth, ID band Patient awake    Reviewed: Allergy & Precautions, H&P , NPO status , Patient's Chart, lab work & pertinent test results  Airway Mallampati: III TM Distance: >3 FB Neck ROM: Full    Dental No notable dental hx. (+) Teeth Intact and Dental Advisory Given   Pulmonary asthma (inhaler use rare, once/year) , pneumonia ,  clear to auscultation  Pulmonary exam normal       Cardiovascular neg cardio ROS Regular Normal    Neuro/Psych  Headaches, Anxiety Negative Neurological ROS     GI/Hepatic Neg liver ROS, GERD-  Medicated and Controlled,  Endo/Other  Morbid obesity  Renal/GU negative Renal ROS     Musculoskeletal   Abdominal (+) obese,   Peds  Hematology   Anesthesia Other Findings   Reproductive/Obstetrics                          Anesthesia Physical Anesthesia Plan  ASA: III  Anesthesia Plan: General   Post-op Pain Management:    Induction: Intravenous  Airway Management Planned: LMA and Oral ETT  Additional Equipment:   Intra-op Plan:   Post-operative Plan:   Informed Consent: I have reviewed the patients History and Physical, chart, labs and discussed the procedure including the risks, benefits and alternatives for the proposed anesthesia with the patient or authorized representative who has indicated his/her understanding and acceptance.   Dental advisory given  Plan Discussed with: CRNA and Surgeon  Anesthesia Plan Comments: (Plan routine monitors, GETA)        Anesthesia Quick Evaluation

## 2011-04-26 NOTE — Brief Op Note (Signed)
04/26/2011  10:34 AM  PATIENT:  [name]  61 y.o. female  PRE-OPERATIVE DIAGNOSIS:  Right Total Hip Arthroplasty Metal on Metal Wear  POST-OPERATIVE DIAGNOSIS:  Right Total Hip Arthroplasty Metal on Metal Wear  PROCEDURE:  Procedure(s): TOTAL HIP REVISION  SURGEON:  Surgeon(s): Eldred Manges  PHYSICIAN ASSISTANT: Maud Deed PAC  ASSISTANTS: none   ANESTHESIA:   general  EBL:  Total I/O In: 1600 [I.V.:1600] Out: 325 [Urine:125; Blood:200]  BLOOD ADMINISTERED:none  DRAINS: none   LOCAL MEDICATIONS USED:  NONE  SPECIMEN:  Source of Specimen:  fluid from bursa and from right hip joint sent for gram stain, C&S.                        Tissue from right hip joint sent  DISPOSITION OF SPECIMEN:  PATHOLOGY ,MICROBIOLOGY  COUNTS:  YES  TOURNIQUET:  * No tourniquets in log *  DICTATION: .Note written in EPIC and Other Dictation: Dictation Number [id number]  PLAN OF CARE: Admit to inpatient   PATIENT DISPOSITION:  PACU - hemodynamically stable.   Delay start of Pharmacological VTE agent (>24hrs) due to surgical blood loss or risk of bleeding:  {YES/NO/NOT APPLICABLE:20182

## 2011-04-27 LAB — CBC
HCT: 37.4 % (ref 36.0–46.0)
Hemoglobin: 11.9 g/dL — ABNORMAL LOW (ref 12.0–15.0)
WBC: 9.4 10*3/uL (ref 4.0–10.5)

## 2011-04-27 LAB — BASIC METABOLIC PANEL
BUN: 8 mg/dL (ref 6–23)
Chloride: 98 mEq/L (ref 96–112)
Glucose, Bld: 134 mg/dL — ABNORMAL HIGH (ref 70–99)
Potassium: 3.9 mEq/L (ref 3.5–5.1)

## 2011-04-27 LAB — PROTIME-INR: INR: 1.09 (ref 0.00–1.49)

## 2011-04-27 MED ORDER — CHLORHEXIDINE GLUCONATE CLOTH 2 % EX PADS
6.0000 | MEDICATED_PAD | Freq: Every day | CUTANEOUS | Status: DC
  Administered 2011-04-27 – 2011-04-28 (×2): 6 via TOPICAL

## 2011-04-27 MED ORDER — MUPIROCIN 2 % EX OINT
1.0000 "application " | TOPICAL_OINTMENT | Freq: Two times a day (BID) | CUTANEOUS | Status: DC
  Administered 2011-04-27 – 2011-04-28 (×4): 1 via NASAL

## 2011-04-27 MED ORDER — WARFARIN SODIUM 7.5 MG PO TABS
7.5000 mg | ORAL_TABLET | Freq: Once | ORAL | Status: AC
  Administered 2011-04-27: 7.5 mg via ORAL
  Filled 2011-04-27: qty 1

## 2011-04-27 NOTE — Op Note (Signed)
[name], [name]               ACCOUNT NO.:  [id number]  MEDICAL RECORD NO.:  [id number]  LOCATION:  5038                         FACILITY:  [facility]  PHYSICIAN:  Margaree Sandhu C. Ophelia [name], M.D.    DATE OF BIRTH:  Sep 29, 1950  DATE OF PROCEDURE:  04/26/2011 DATE OF DISCHARGE:                              OPERATIVE REPORT   PREOPERATIVE DIAGNOSIS:  Right painful hip with metallosis.  PROCEDURE:  Right hip revision with conversion from metal-on-poly to ceramic-on-metal.  SURGEON:  Christy Fifield C. Ophelia Charter, MD  ASSISTANT;  Maud Deed PA-C medically necessary and present for the entire procedure.  ANESTHESIA:  General.  EBL:  Minimal.  INDICATIONS:  A 61 year old female who has had previous total hip arthroplasty, which was performed on Aug 31, 2008.  She had 2-piece of metal acetabular component and it done well until last few months when she suddenly had onset of significant increased pain and discomfort in her hip.  Sed rate was 32, CRP was 0.85.  Aspirate was negative for infection.  Venous Doppler test was negative.  Three-phase bone scan showed no evidence of aseptic loosening, and metal ion level showed significant elevation of her cobalt level at 21.4 and a chromium of 4. Based on elevated metal ion levels and pain, she was brought in for revision.  There was no evidence of loosening of either component.  PROCEDURE:  After induction of general anesthesia, orotracheal intubation, preoperative antibiotic prophylaxis, time-out procedure, the patient was in lateral position with axillary roll, careful padding and positioning due to her very large size.  Hip was prepped with DuraPrep. The usual total hip, sheets, drapes, impervious stockinette, and three Steri-Drapes were required in order to seal the skin.  Old incision had been marked before application of the Betadine, Steri-Drape.  Old incision was opened.  Subcutaneous tissue was sharply dissected. Immediately 2 inches down, there was a  pocket of milky-looking fluid, which was capsulated with a thin capsule without evidence of necrosis. Aerobic and anaerobic cultures were immediately obtained and a stat Gram- stain was sent to pathology, which reported no organisms and no white blood cells.  Some portions of tissue were removed for pathologic exam. Continued dissection down and this pocket extended down through the gap in the gluteus medius into a thin funnel that extended down to the hip joint.  With the removal of the external rotators off of the hip just above the lesser trochanter, similar-looking inflow was identified around the neck of the prosthesis.  Deep cultures were obtained.  There was some black metal debris in the capsule, this was excised.  There was no evidence of necrosis, the gross appearance did not appear consistent with the hip infection.  Soft tissue was excised.  Hip was dislocated. Newman Pies was popped off with the impactor, covered with a sponge, and inspection of the neck revealed no evidence of neck impingement.  Soft tissue was meticulously cleaned around the metal-on-metal.  Since this was the 2-piece, a small suction cup was inserted after meticulous cleaning and with a sharp curved replaced anteriorly keeping the femoral stem, trunnion sitting superior and slightly anterior.  For the next 45 minutes, multiple combinations were used in an  attempt to dislodge the 2- piece metal liner.  This includes using twitter from metal impactors tapping, 0.035, 0.045 and 0.062 K-wires as well as Glorious Peach and the edge with tapping using a saw blade oscillation against the edge in order to creat the [place] required to break the bond.  All these were completely unsuccessful.  Rim was reinspected multiple times and there were no areas of soft tissue overlying.  There was complete exposure of the entire rim.  Incision was extended distally taking up all of the old incision, splitting the tensor fascia more  in order to change the angle of the suction cup to see if this would make a difference and then all things were repeated again for another 20-30 minutes.  Using the [place], the impactors with a hammer, the saw blade etc., and all these were unsuccessful.  At this point, options were to take the entire acetabulum out and revised it first to switch to a ceramic ball.  The patient's metal liner appeared normal.  There was no evidence of loosening. Despite all the tapping pounding, saw blade that had been performed in order to break the bond, which was unsuccessful.  A ceramic ball was selected 1.5 with a 36 ball, which corresponded to the metal ball that had been removed.  Pulsatile lavage was used first, results from the stat Gram-stain returned as listed above with no organisms and no white cells.  Newman Pies was tapped on and after meticulous drying of the trunnion was secured tested by hand and then reduced.  There was good stability, flexion to 90, internal rotation to 80.  Pulsatile lavage was used and then standard layered closure tensor fascia, 2-0 Vicryl subcutaneous tissue, skin closure, postop dressing, and knee immobilizer.  Instrument count and needle count were correct.     Viola Kinnick C. Ophelia [name], M.D.     MCY/MEDQ  D:  04/26/2011  T:  04/27/2011  Job:  [id number]  cc:   [employer]

## 2011-04-27 NOTE — Progress Notes (Signed)
Physical Therapy Evaluation Patient Details Name: [name] MRN: [id number] DOB: Nov 19, 1950 Today's Date: 04/27/2011  Problem List:  Patient Active Problem List  Diagnoses  . Status post THR (total hip replacement)    Past Medical History:  Past Medical History  Diagnosis Date  . Asthma   . Bronchitis   . Pneumonia   . Anemia   . Chronic kidney disease     urinary tract infection  . GERD (gastroesophageal reflux disease)   . H/O hiatal hernia   . Headache   . Arthritis     osteoarthritis  . Anxiety     xanax, 2 tabs per day  . Incontinence     wears pad   Past Surgical History:  Past Surgical History  Procedure Date  . Appendectomy   . Tubal ligation   . Abdominal hysterectomy     partial  . Bunionectomy     right  . Ankle surgery     with plate on right and left  . Fracture surgery     left wrist fracture repair  with plate  . Hip arthroplasty     right    PT Assessment/Plan/Recommendation PT Assessment Clinical Impression Statement: Pt presents with a medical diagnosis of Right THA along with the following impairments/deficits and therapy diagnosis listed below. Pt will benefit from skilled PT in the acute care setting in order to maximize functional mobility for a safe d/c home PT Recommendation/Assessment: Patient will need skilled PT in the acute care venue PT Problem List: Decreased strength;Decreased range of motion;Decreased activity tolerance;Decreased mobility;Decreased knowledge of use of DME;Decreased knowledge of precautions;Pain PT Therapy Diagnosis : Difficulty walking;Acute pain PT Plan PT Frequency: 7X/week PT Treatment/Interventions: DME instruction;Gait training;Stair training;Functional mobility training;Therapeutic activities;Therapeutic exercise;Patient/family education PT Recommendation Follow Up Recommendations: Home health PT;Supervision/Assistance - 24 hour Equipment Recommended: None recommended by PT PT Goals  Acute Rehab PT  Goals PT Goal Formulation: With patient Time For Goal Achievement: 7 days Pt will go Supine/Side to Sit: with modified independence PT Goal: Supine/Side to Sit - Progress: Progressing toward goal Pt will go Sit to Stand: with modified independence PT Goal: Sit to Stand - Progress: Progressing toward goal Pt will go Stand to Sit: with modified independence PT Goal: Stand to Sit - Progress: Progressing toward goal Pt will Transfer Bed to Chair/Chair to Bed: with supervision PT Transfer Goal: Bed to Chair/Chair to Bed - Progress: Progressing toward goal Pt will Ambulate: 51 - 150 feet;with supervision;with rolling walker PT Goal: Ambulate - Progress: Progressing toward goal Pt will Perform Home Exercise Program: Independently PT Goal: Perform Home Exercise Program - Progress: Progressing toward goal Additional Goals Additional Goal #1: Pt will be able to recall and demonstrate 3/3 hip precautions PT Goal: Additional Goal #1 - Progress: Progressing toward goal  PT Evaluation Precautions/Restrictions  Precautions Precautions: Posterior Hip Restrictions Weight Bearing Restrictions: Yes RLE Weight Bearing: Weight bearing as tolerated Prior Functioning  Home Living Lives With: Sheran Spine Help From: Family Type of Home: House Home Layout: One level Home Access: Ramped entrance Bathroom Shower/Tub: Walk-in shower;Door [employer] Toilet: Standard Bathroom Accessibility: Yes How Accessible: Accessible via walker Home Adaptive Equipment: Walker - rolling;Shower chair with back;Grab bars in shower;Straight cane Prior Function Level of Independence: Independent with basic ADLs;Independent with gait;Independent with transfers (ambulating with a cane) Able to Take Stairs?: Yes Driving: Yes Vocation: Full time employment Cognition Cognition Arousal/Alertness: Awake/alert Overall Cognitive Status: Appears within functional limits for tasks assessed Orientation Level: Oriented  X4  Sensation/Coordination Sensation Light Touch: Appears Intact Extremity Assessment RLE Assessment RLE Assessment: Exceptions to [facility] RLE AROM (degrees) Overall AROM Right Lower Extremity: Deficits;Due to pain;Due to precautions (Knee and Ankle WFL; UTA Hip) RLE Strength RLE Overall Strength: Deficits;Due to precautions;Due to pain (Knee and Ankle WFL; Pt able to complete SLR with min assist) LLE Assessment LLE Assessment: Within Functional Limits Mobility (including Balance) Bed Mobility Bed Mobility: Yes Supine to Sit: 4: Min assist Supine to Sit Details (indicate cue type and reason): VC for sequencing. Min assist with RLE as well as cues to maintain hip precautions with transfer. No physical assist needed at UE or trunk Sitting - Scoot to Edge of Bed: 4: Min assist Sitting - Scoot to [place] of Bed Details (indicate cue type and reason): VC for hand placement and weight shifting while maintaining hip precautions Transfers Transfers: Yes Sit to Stand: 4: Min assist;With upper extremity assist;From bed;From chair/3-in-1 Sit to Stand Details (indicate cue type and reason): VC for hand placement and maintaining hip precautions to avoid bending. Physical assist for initiation of stand Stand to Sit: 4: Min assist;With upper extremity assist;To chair/3-in-1 Stand to Sit Details: VC for hand placement for safety and leg placement to maintain hip precautions Stand Pivot Transfers: 4: Min assist Stand Pivot Transfer Details (indicate cue type and reason): Transfer from bed to 3-1 at bedside. Physical assist for stability. Cues for proper sequencing and hand placement Ambulation/Gait Ambulation/Gait: Yes Ambulation/Gait Assistance: 4: Min assist Ambulation/Gait Assistance Details (indicate cue type and reason): VC for proper gait sequencing as well as hip precautions while turning. Assist for stability.  (Pt felt lightheaded after a short distance and sat down) Ambulation Distance (Feet): 10  Feet (distance limited by lightheadedness) Assistive device: Rolling walker Gait Pattern: Step-to pattern;Decreased step length - left;Decreased stance time - right;Decreased hip/knee flexion - right;Decreased weight shift to right;Trunk flexed Gait velocity: Decreased gait speed Stairs: No    Exercise  Total Joint Exercises Ankle Circles/Pumps: AROM;Strengthening;Both;10 reps Quad Sets: AROM;Strengthening;Right;10 reps;Supine Straight Leg Raises: AAROM;Strengthening;Right;10 reps;Supine End of Session PT - End of Session Equipment Utilized During Treatment: Gait belt;Right knee immobilizer Activity Tolerance: Treatment limited secondary to agitation (decreased BP) Patient left: in chair;with family/visitor present;with call bell in reach Nurse Communication: Mobility status for transfers;Mobility status for ambulation;Other (comment) (BP) General Behavior During Session: [facility] for tasks performed Cognition: [facility] for tasks performed  Milana Kidney 04/27/2011, 4:47 PM  04/27/2011 Milana Kidney DPT PAGER: [phone] OFFICE: [phone]

## 2011-04-27 NOTE — Progress Notes (Signed)
Corrections made on VTE. Foley cath, D51/2 NS @ 60ml/hr, Morphine PCA d/c'd per order. IV site saline lock at this time.

## 2011-04-27 NOTE — Progress Notes (Signed)
Subjective: 1 Day Post-Op Procedure(s) (LRB): TOTAL HIP REVISION (Right) No complaints    Objective: Vital signs in last 24 hours: Temp:  [97 F (36.1 C)-99.5 F (37.5 C)] 99.5 F (37.5 C) (01/11 2150) Pulse Rate:  [55-96] 96  (01/11 2150) Resp:  [11-20] 20  (01/11 2150) BP: (84-126)/(45-65) 99/62 mmHg (01/11 2150) SpO2:  [93 %-100 %] 96 % (01/11 2150) Weight:  [142.429 kg (314 lb)] 142.429 kg (314 lb) (01/11 1451)  Intake/Output from previous day: 01/11 0701 - 01/12 0700 In: 1600 [I.V.:1600] Out: 1625 [Urine:1425; Blood:200] Intake/Output this shift: Total I/O In: -  Out: 1300 [Urine:1300]   Basename 04/25/11 1556  HGB 14.0    Basename 04/25/11 1556  WBC 11.6*  RBC 4.56  HCT 42.1  PLT 313    Basename 04/25/11 1556  NA 140  K 3.5  CL 96  CO2 31  BUN 15  CREATININE 0.90  GLUCOSE 87  CALCIUM 10.2    Basename 04/25/11 1556  LABPT --  INR 0.95    Neurologically intact  Assessment/Plan: 1 Day Post-Op Procedure(s) (LRB): TOTAL HIP REVISION (Right) Up with therapy D/c pca  Maliha Outten V 04/27/2011, 6:13 AM

## 2011-04-27 NOTE — Progress Notes (Signed)
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 INR is below goal and unchanged after first dose. No bleeding noted.  Goal of Therapy:  INR 2-3   Plan:  1. Coumadin 7.5 mg po tonight 2. INR daily   Allergies  Allergen Reactions  . Shellfish Allergy     Allergy testing showed being highly allergic to shellfish. Betadine used in past surgeries w/out problems.//L. Love,RN    Patient Measurements: Height: 5\' 6"  (167.6 cm) Weight: 314 lb (142.429 kg) IBW/kg (Calculated) : 59.3   Vital Signs: Temp: 98.9 F (37.2 C) (01/12 0626) BP: 102/64 mmHg (01/12 0626) Pulse Rate: 91  (01/12 0626)  Labs:  Basename 04/27/11 0630 04/25/11 1556  HGB 11.9* 14.0  HCT 37.4 42.1  PLT 188 313  APTT -- --  LABPROT 14.3 12.9  INR 1.09 0.95  HEPARINUNFRC -- --  CREATININE 0.73 0.90  CKTOTAL -- --  CKMB -- --  TROPONINI -- --   Estimated Creatinine Clearance: 106.5 ml/min (by C-G formula based on Cr of 0.73).   Medications:  Scheduled:    . ALPRAZolam  1 mg Oral QHS  . ceFAZolin (ANCEF) IV  2 g Intravenous Q6H  . Chlorhexidine Gluconate Cloth  6 each Topical Q0600  . coumadin book   Does not apply Once  . ferrous sulfate  325 mg Oral Q breakfast  . mupirocin ointment  1 application Nasal BID  . pantoprazole  40 mg Oral Q1200  . simvastatin  20 mg Oral q1800  . torsemide  20 mg Oral Daily  . warfarin  7.5 mg Oral ONCE-1800  . warfarin   Does not apply Once  . DISCONTD: ALPRAZolam  1 mg Oral QPM  . DISCONTD: chlorhexidine  60 mL Topical Once  . DISCONTD: Iron  325 mg Oral Daily  . DISCONTD: Iron  325 mg Oral Q breakfast  . DISCONTD: morphine   Intravenous Q4H    [place], Lysa Livengood Danielle 04/27/2011,1:41 PM

## 2011-04-28 LAB — CBC
HCT: 36.7 % (ref 36.0–46.0)
MCV: 93.4 fL (ref 78.0–100.0)
RBC: 3.93 MIL/uL (ref 3.87–5.11)
WBC: 9.1 10*3/uL (ref 4.0–10.5)

## 2011-04-28 MED ORDER — WARFARIN SODIUM 7.5 MG PO TABS
7.5000 mg | ORAL_TABLET | Freq: Once | ORAL | Status: AC
  Administered 2011-04-28: 7.5 mg via ORAL
  Filled 2011-04-28: qty 1

## 2011-04-28 NOTE — Progress Notes (Signed)
Physical Therapy Treatment Patient Details Name: [name] MRN: [id number] DOB: [birth year] Today's Date: 04/28/2011  PT Assessment/Plan  PT - Assessment/Plan Comments on Treatment Session: Pt made great progress this AM.  No c/o dizziness/lightheadiness with activity.   PT Plan: Discharge plan remains appropriate PT Frequency: 7X/week Follow Up Recommendations: Home health PT;Supervision/Assistance - 24 hour Equipment Recommended: None recommended by PT PT Goals  Acute Rehab PT Goals PT Goal: Sit to Stand - Progress: Progressing toward goal PT Goal: Stand to Sit - Progress: Progressing toward goal PT Goal: Ambulate - Progress: Progressing toward goal PT Goal: Perform Home Exercise Program - Progress: Progressing toward goal Additional Goals PT Goal: Additional Goal #1 - Progress: Progressing toward goal  PT Treatment Precautions/Restrictions  Precautions Precautions: Posterior Hip Restrictions Weight Bearing Restrictions: Yes RLE Weight Bearing: Weight bearing as tolerated Mobility (including Balance) Transfers Sit to Stand: Other (comment);With upper extremity assist;From chair/3-in-1;With armrests (Min Guard (A)) Sit to Stand Details (indicate cue type and reason): Cues for hand placement & technique to maintaini hip precautions.  Performed 3x's.   Stand to Sit: Other (comment);To chair/3-in-1;With upper extremity assist;With armrests (Min Guard (A)) Stand to Sit Details: Cues for hand placement, use of UE's to control descent, technique to maintain hip precautions.  Performed 3x's Ambulation/Gait Ambulation/Gait Assistance: Other (comment) (Min Guard (A)) Ambulation/Gait Assistance Details (indicate cue type and reason): Close guarding for safety due to pt with lightheadiness in yesterday's session but no complaints today.  Cues for sequencing & reinforcement of hip precautions with direction change.   Ambulation Distance (Feet): 100 Feet Assistive device: Rolling  walker Gait Pattern: Step-through pattern;Decreased stance time - right;Decreased step length - left Stairs: No Wheelchair Mobility Wheelchair Mobility: No    Exercise  Total Joint Exercises Ankle Circles/Pumps: AROM;Both;10 reps;Seated Long Arc Quad: Strengthening;Right;10 reps;Seated Marching in Standing: Strengthening;Right;10 reps;Standing End of Session PT - End of Session Equipment Utilized During Treatment: Gait belt Activity Tolerance: Patient tolerated treatment well Patient left: in chair;with call bell in reach Nurse Communication: Mobility status for transfers;Mobility status for ambulation General Behavior During Session: [facility] for tasks performed Cognition: [facility] for tasks performed  Lara Mulch 04/28/2011, 11:43 AM [phone]

## 2011-04-28 NOTE — Progress Notes (Signed)
Physical Therapy Treatment Patient Details Name: [name] MRN: [id number] DOB: [birth year] Today's Date: 04/28/2011  PT Assessment/Plan  PT - Assessment/Plan Comments on Treatment Session: Continues to progress well with mobility & PT goals.  Ok'd pt to ambulate to/from 3-in-1 by herself.  RN aware.   PT Plan: Discharge plan remains appropriate PT Frequency: 7X/week Follow Up Recommendations: Home health PT;Supervision/Assistance - 24 hour Equipment Recommended: None recommended by PT PT Goals  Acute Rehab PT Goals PT Goal: Supine/Side to Sit - Progress: Met PT Goal: Sit to Stand - Progress: Progressing toward goal PT Goal: Stand to Sit - Progress: Progressing toward goal PT Goal: Ambulate - Progress: Met PT Goal: Perform Home Exercise Program - Progress: Progressing toward goal Additional Goals PT Goal: Additional Goal #1 - Progress: Progressing toward goal  PT Treatment Precautions/Restrictions  Precautions Precautions: Posterior Hip Restrictions Weight Bearing Restrictions: Yes RLE Weight Bearing: Weight bearing as tolerated Mobility (including Balance) Bed Mobility Supine to Sit: 6: Modified independent (Device/Increase time);HOB flat Sit to Supine: 6: Modified independent (Device/Increase time);HOB flat Transfers Sit to Stand: From chair/3-in-1;With armrests;With upper extremity assist;Other (comment);5: Supervision (Min Guard (A) from recliner) Sit to Stand Details (indicate cue type and reason): Cues for technique to maintain precautions.  (S) from bed & 3-in-1 Stand to Sit: Other (comment);To chair/3-in-1;To bed;With upper extremity assist;With armrests (Min Guard (A)) Stand to Sit Details: Cues for use of UE's to control descent to recliner (lower surface).  Did better with controlled descent with sitting to bed & 3-in-1.   Ambulation/Gait Ambulation/Gait Assistance: 5: Supervision Ambulation/Gait Assistance Details (indicate cue type and reason): Cues to  increase & maintain erect posture.   Ambulation Distance (Feet): 200 Feet Assistive device: Rolling walker Gait Pattern: Step-through pattern;Decreased stance time - right Stairs: No Wheelchair Mobility Wheelchair Mobility: No    Exercise  Total Joint Exercises Ankle Circles/Pumps: AROM;Both;10 reps;Seated Quad Sets: AROM;Strengthening;Both;10 reps;Seated Gluteal Sets: Strengthening;AROM;Both;10 reps;Seated Hip ABduction/ADduction: AROM;Left;10 reps;Other (comment) (long sitting in recliner) Long Arc Quad: Strengthening;Right;10 reps;Seated Marching in Standing: Strengthening;Right;10 reps;Standing End of Session PT - End of Session Equipment Utilized During Treatment: Gait belt Activity Tolerance: Patient tolerated treatment well Patient left: in chair;with call bell in reach Nurse Communication: Mobility status for ambulation General Behavior During Session: [facility] for tasks performed Cognition: [facility] for tasks performed  Lara Mulch 04/28/2011, 2:09 PM [phone]

## 2011-04-28 NOTE — Progress Notes (Signed)
Occupational Therapy Evaluation and D/C Patient Details Name: [name] MRN: [id number] DOB: 1950-11-07 Today's Date: 04/28/2011  Problem List:  Patient Active Problem List  Diagnoses  . Status post THR (total hip replacement)    Past Medical History:  Past Medical History  Diagnosis Date  . Asthma   . Bronchitis   . Pneumonia   . Anemia   . Chronic kidney disease     urinary tract infection  . GERD (gastroesophageal reflux disease)   . H/O hiatal hernia   . Headache   . Arthritis     osteoarthritis  . Anxiety     xanax, 2 tabs per day  . Incontinence     wears pad   Past Surgical History:  Past Surgical History  Procedure Date  . Appendectomy   . Tubal ligation   . Abdominal hysterectomy     partial  . Bunionectomy     right  . Ankle surgery     with plate on right and left  . Fracture surgery     left wrist fracture repair  with plate  . Hip arthroplasty     right    OT Assessment/Plan/Recommendation OT Assessment Clinical Impression Statement: Patient admitted for Rt THR. All education completed and to have necessary level of assist from family upon d/c. Patient with no DME or AE needs at this time. No further OT indicated - signing off.  OT Recommendation/Assessment: Patient does not need any further OT services OT Recommendation Follow Up Recommendations: No OT follow up Equipment Recommended: None recommended by PT;None recommended by OT  OT Evaluation Precautions/Restrictions  Precautions Precautions: Posterior Hip Restrictions Weight Bearing Restrictions: Yes RLE Weight Bearing: Weight bearing as tolerated Prior Functioning Home Living Lives With: Sheran Spine Help From: Family Type of Home: House Home Layout: One level Home Access: Ramped entrance Bathroom Shower/Tub: Walk-in shower;Door [employer] Toilet: Standard Bathroom Accessibility: Yes How Accessible: Accessible via walker Home Adaptive Equipment: Walker - rolling;Shower  chair with back;Grab bars in shower;Straight cane Prior Function Level of Independence: Independent with basic ADLs;Independent with gait;Independent with transfers Able to Take Stairs?: Yes Driving: Yes Vocation: Full time employment ADL ADL Eating/Feeding: Simulated;Independent Where Assessed - Eating/Feeding: Chair Grooming: Simulated;Independent Where Assessed - Grooming: Standing at sink Upper Body Bathing: Simulated;Set up Where Assessed - Upper Body Bathing: Sitting, chair Lower Body Bathing: Simulated;Supervision/safety Where Assessed - Lower Body Bathing: Sit to stand from chair Upper Body Dressing: Simulated;Set up Where Assessed - Upper Body Dressing: Sitting, chair Lower Body Dressing: Simulated;Moderate assistance Lower Body Dressing Details (indicate cue type and reason): patient has all necessary AE from previous surgery and is comfortable with using for LB dressing; also, states sons are available and willing to assist as needed Where Assessed - Lower Body Dressing: Sit to stand from chair Toilet Transfer: Performed;Modified independent Toilet Transfer Method: Proofreader: Extra wide bedside commode Toileting - Clothing Manipulation: Simulated;Independent Where Assessed - Toileting Clothing Manipulation: Standing Toileting - Hygiene: Simulated;Modified independent Where Assessed - Toileting Hygiene: Sit to stand from 3-in-1 or toilet Tub/Shower Transfer: Simulated;Supervision/safety Tub/Shower Transfer Details (indicate cue type and reason): simulated in room. patient has grab bars inside and outside of shower at home that she can steady herself with. Also, encouraged patient to do "dry run" with son before performing alone.  Tub/Shower Transfer Method: Ambulating Equipment Used: Rolling walker Ambulation Related to ADLs: Mod I with RW ambulation.  ADL Comments: Pt. supervision with sit <-> stand from lower surfaces with  armrests. All  education completed and patient to have necessary level of assist upon d/c home Cognition Cognition Overall Cognitive Status: Appears within functional limits for tasks assessed Orientation Level: Oriented X4 Sensation/Coordination Sensation Light Touch: Appears Intact Coordination Gross Motor Movements are Fluid and Coordinated: Yes Fine Motor Movements are Fluid and Coordinated: Yes Extremity Assessment RUE Assessment RUE Assessment: Within Functional Limits LUE Assessment LUE Assessment: Within Functional Limits Mobility  Bed Mobility Supine to Sit: 6: Modified independent (Device/Increase time);HOB flat Supine to Sit Details (indicate cue type and reason): VC for sequencing. Min assist with RLE as well as cues to maintain hip precautions with transfer. No physical assist needed at UE or trunk Sit to Supine: 6: Modified independent (Device/Increase time);HOB flat Transfers Sit to Stand: From chair/3-in-1;With armrests;With upper extremity assist;5: Supervision Sit to Stand Details (indicate cue type and reason): Cues for technique to maintain precautions.  (S) from bed  Stand to Sit: 5: Supervision;To chair/3-in-1;With armrests Stand to Sit Details: Cues for use of UE's to control descent to recliner (lower surface). Did better with controlled descent with sitting to bed & 3-in-1.  End of Session OT - End of Session Equipment Utilized During Treatment: Gait belt Activity Tolerance: Patient tolerated treatment well Patient left: in chair;with call bell in reach Nurse Communication: Mobility status for transfers;Mobility status for ambulation General Behavior During Session: [facility] for tasks performed Cognition: [facility] for tasks performed   Yeriel Mineo 04/28/2011, 2:59 PM

## 2011-04-28 NOTE — Progress Notes (Signed)
Subjective: 2 Days Post-Op Procedure(s) (LRB): TOTAL HIP REVISION (Right) Ambulated about 10 feet yesterday    Objective: Vital signs in last 24 hours: Temp:  [99.1 F (37.3 C)-100.2 F (37.9 C)] 99.2 F (37.3 C) (01/13 0549) Pulse Rate:  [80-101] 80  (01/13 0549) Resp:  [20] 20  (01/13 0549) BP: (107-135)/(52-57) 135/52 mmHg (01/13 0549) SpO2:  [92 %-98 %] 98 % (01/13 0549)  Intake/Output from previous day: 01/12 0701 - 01/13 0700 In: 660 [P.O.:660] Out: 650 [Urine:650] Intake/Output this shift:     Basename 04/28/11 0700 04/27/11 0630 04/25/11 1556  HGB 11.7* 11.9* 14.0    Basename 04/28/11 0700 04/27/11 0630  WBC 9.1 9.4  RBC 3.93 4.00  HCT 36.7 37.4  PLT 212 188    Basename 04/27/11 0630 04/25/11 1556  NA 134* 140  K 3.9 3.5  CL 98 96  CO2 28 31  BUN 8 15  CREATININE 0.73 0.90  GLUCOSE 134* 87  CALCIUM 8.6 10.2    Basename 04/27/11 0630 04/25/11 1556  LABPT -- --  INR 1.09 0.95    Neurologically intact  Assessment/Plan: 2 Days Post-Op Procedure(s) (LRB): TOTAL HIP REVISION (Right) Up with therapy  Caralina Nop V 04/28/2011, 8:11 AM

## 2011-04-28 NOTE — Progress Notes (Signed)
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 INR is below goal but starting to trend up. No bleeding noted. CBC is stable.  Goal of Therapy:  INR 2-3   Plan:  1. Coumadin 7.5 mg po tonight 2. INR daily   Allergies  Allergen Reactions  . Shellfish Allergy     Allergy testing showed being highly allergic to shellfish. Betadine used in past surgeries w/out problems.//L. Love,RN    Patient Measurements: Height: 5\' 6"  (167.6 cm) Weight: 314 lb (142.429 kg) IBW/kg (Calculated) : 59.3   Vital Signs: Temp: 99.2 F (37.3 C) (01/13 0549) Temp src: Oral (01/13 0549) BP: 135/52 mmHg (01/13 0549) Pulse Rate: 80  (01/13 0549)  Labs:  Basename 04/28/11 0700 04/27/11 0630 04/25/11 1556  HGB 11.7* 11.9* --  HCT 36.7 37.4 42.1  PLT 212 188 313  APTT -- -- --  LABPROT 16.4* 14.3 12.9  INR 1.30 1.09 0.95  HEPARINUNFRC -- -- --  CREATININE -- 0.73 0.90  CKTOTAL -- -- --  CKMB -- -- --  TROPONINI -- -- --   Estimated Creatinine Clearance: 106.5 ml/min (by C-G formula based on Cr of 0.73).   Medications:  Scheduled:     . ALPRAZolam  1 mg Oral QHS  . Chlorhexidine Gluconate Cloth  6 each Topical Q0600  . ferrous sulfate  325 mg Oral Q breakfast  . mupirocin ointment  1 application Nasal BID  . pantoprazole  40 mg Oral Q1200  . simvastatin  20 mg Oral q1800  . torsemide  20 mg Oral Daily  . warfarin  7.5 mg Oral ONCE-1800  . warfarin   Does not apply Once    [facility] 04/28/2011,10:07 AM

## 2011-04-29 ENCOUNTER — Encounter (HOSPITAL_COMMUNITY): Payer: Self-pay | Admitting: Orthopaedic Surgery

## 2011-04-29 LAB — PROTIME-INR: INR: 1.42 (ref 0.00–1.49)

## 2011-04-29 MED ORDER — WARFARIN SODIUM 10 MG PO TABS
10.0000 mg | ORAL_TABLET | Freq: Once | ORAL | Status: DC
  Filled 2011-04-29: qty 1

## 2011-04-29 MED ORDER — WARFARIN SODIUM 10 MG PO TABS: 5.0000 mg | ORAL_TABLET | Freq: Once | ORAL | Status: DC

## 2011-04-29 MED ORDER — OXYCODONE-ACETAMINOPHEN 5-325 MG PO TABS: 1.0000 | ORAL_TABLET | ORAL | Status: AC | PRN

## 2011-04-29 MED ORDER — METHOCARBAMOL 500 MG PO TABS: 500.0000 mg | ORAL_TABLET | Freq: Four times a day (QID) | ORAL | Status: AC | PRN

## 2011-04-29 NOTE — Discharge Summary (Signed)
Patient ID: [name] MRN: [id number] DOB/AGE: 1950/05/12 61 y.o.  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 Asthma   . Bronchitis   . Pneumonia   . Anemia   . Chronic kidney disease     urinary tract infection  . GERD (gastroesophageal reflux disease)   . H/O hiatal hernia   . Headache   . Arthritis     osteoarthritis  . Anxiety     xanax, 2 tabs per day  . Incontinence     wears pad    Surgeries: Procedure(s): TOTAL HIP REVISION on 04/26/2011  revision of femoral head metal component to ceramic component. Consultants:  none  Discharged Condition: Improved  Hospital Course: [name] is an 61 y.o. female who was admitted 04/26/2011 for operative treatment ofStatus post THR (total hip replacement). Patient has severe unremitting pain that affects sleep, daily activities, and work/hobbies. After pre-op clearance the patient was taken to the operating room on 04/26/2011 and underwent  Procedure(s): TOTAL HIP REVISION.    Patient was given perioperative antibiotics: Anti-infectives     Start     Dose/Rate Route Frequency Ordered Stop   04/26/11 1630   ceFAZolin (ANCEF) IVPB 2 g/50 mL premix        2 g 100 mL/hr over 30 Minutes Intravenous Every 6 hours 04/26/11 1508 04/27/11 0506   04/25/11 1730   ceFAZolin (ANCEF) IVPB 2 g/50 mL premix        2 g 100 mL/hr over 30 Minutes Intravenous 60 min pre-op 04/25/11 1725 04/26/11 0738           Patient was given sequential compression devices, early ambulation, and chemoprophylaxis to prevent DVT.  Patient benefited maximally from hospital stay and there were no complications.    Recent vital signs: Patient Vitals for the past 24 hrs:  BP Temp Temp src Pulse Resp SpO2  04/29/11 1358 116/63  mmHg 98.3 F (36.8 C) - 93  18  96 %  04/29/11 1354 116/63 mmHg 98.3 F (36.8 C) - 93  18  96 %  04/29/11 0608 109/57 mmHg 98.3 F (36.8 C) Oral 80  20  100 %  2011/05/27 2053 132/62 mmHg 98.5 F (36.9 C) Oral 95  18  95 %  2011-05-27 1443 104/62 mmHg 98.2 F (36.8 C) Oral 84  18  96 %     Recent laboratory studies:  Basename 04/29/11 0445 27-May-2011 0700 04/27/11 0630  WBC -- 9.1 9.4  HGB -- 11.7* 11.9*  HCT -- 36.7 37.4  PLT -- 212 188  NA -- -- 134*  K -- -- 3.9  CL -- -- 98  CO2 -- -- 28  BUN -- -- 8  CREATININE -- -- 0.73  GLUCOSE -- -- 134*  INR 1.42 1.30 --  CALCIUM -- -- 8.6     Discharge Medications:  Current Discharge Medication List    START taking these medications   Details  methocarbamol (ROBAXIN) 500 MG tablet Take 1 tablet (500 mg total) by mouth every 6 (six) hours as needed (spasm). Qty: 40 tablet, Refills: 0    oxyCODONE-acetaminophen (PERCOCET) 5-325 MG per tablet Take 1-2 tablets by mouth every 4 (four) hours as needed. Qty: 60 tablet, Refills: 0    warfarin (COUMADIN) 10 MG tablet  Take 0.5 tablets (5 mg total) by mouth one time only at 6 PM. Qty: 30 tablet, Refills: 0      CONTINUE these medications which have NOT CHANGED   Details  ALPRAZolam (XANAX XR) 0.5 MG 24 hr tablet Take 1 mg by mouth every evening.    aspirin EC 81 MG tablet Take 81 mg by mouth daily.    Calcium Carbonate-Vitamin D (CALCIUM 600 + D PO) Take 1 tablet by mouth daily.    esomeprazole (NEXIUM) 40 MG capsule Take 40 mg by mouth daily before breakfast.    Ferrous Sulfate (IRON) 325 (65 FE) MG TABS Take 325 mg by mouth daily.    pravastatin (PRAVACHOL) 40 MG tablet Take 40 mg by mouth daily.    torsemide (DEMADEX) 20 MG tablet Take 20 mg by mouth daily.        Diagnostic Studies: Dg Chest 2 View  04/25/2011  *RADIOLOGY REPORT*  Clinical Data: Preoperative respiratory examination for revision of right total hip arthroplasty.  CHEST - 2 VIEW  Comparison: 08/29/2008  radiographs.  Findings: Detail is limited by body habitus.  Heart size and mediastinal contours are stable.  The lungs are clear.  There is no pleural effusion or pneumothorax.  No acute osseous findings are identified.  IMPRESSION: Stable examination.  No active cardiopulmonary process demonstrated.  Original Report Authenticated By: Gerrianne Scale, M.D.   Dg Pelvis Portable  04/26/2011  *RADIOLOGY REPORT*  Clinical Data: Post right hip replacement.  PORTABLE PELVIS,PORTABLE RIGHT HIP - 1 VIEW  Comparison: None.  Findings: Post total right hip replacement which appears in satisfactory position.  The subtle lucency along the femoral component tip on the lateral view is felt to represent overlapping structures.  This can be assessed on follow-up.  Moderate left hip joint degenerative changes.  IMPRESSION: Satisfactory position total right hip replacement as noted above.  Original Report Authenticated By: Fuller Canada, M.D.   Dg Hip Portable 1 View Right  04/26/2011  *RADIOLOGY REPORT*  Clinical Data: Post right hip replacement.  PORTABLE PELVIS,PORTABLE RIGHT HIP - 1 VIEW  Comparison: None.  Findings: Post total right hip replacement which appears in satisfactory position.  The subtle lucency along the femoral component tip on the lateral view is felt to represent overlapping structures.  This can be assessed on follow-up.  Moderate left hip joint degenerative changes.  IMPRESSION: Satisfactory position total right hip replacement as noted above.  Original Report Authenticated By: Fuller Canada, M.D.    Disposition:   Discharge Orders    Future Orders Please Complete By Expires   Diet - low sodium heart healthy      Call MD / Call 911      Comments:   If you experience chest pain or shortness of breath, CALL 911 and be transported to the hospital emergency room.  If you develope a fever above 101 F, pus (white drainage) or increased drainage or redness at the wound, or calf pain, call your  surgeon's office.   Constipation Prevention      Comments:   Drink plenty of fluids.  Prune juice may be helpful.  You may use a stool softener, such as Colace (over the counter) 100 mg twice a day.  Use MiraLax (over the counter) for constipation as needed.   Increase activity slowly as tolerated      Weight Bearing as taught in Physical Therapy      Comments:   Use a walker or crutches as  instructed.   Discharge instructions      Comments:   Change dressing daily or as needed.  Keep wound dry and clean.. Weight bear as tolerated with walker.  Total hip replacement precautions.  Take coumadin as instructed by pharmacist   Follow the hip precautions as taught in Physical Therapy         Follow-up Information    Follow up with YATES,MARK C. Schedule an appointment as soon as possible for a visit in 1 week.   Contact information:   [facility] [address] [place] [address] [phone]           Signed: Wende Neighbors 04/29/2011, 2:35 PM

## 2011-04-29 NOTE — Progress Notes (Signed)
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 INR is below goal = 1.42 and slowly trending up on 7.5mg  x 3 doses. No bleeding noted.   Goal of Therapy:  INR 2-3   Plan:  1. Coumadin 10 mg po tonight 2. INR daily    Allergies  Allergen Reactions  . Shellfish Allergy     Allergy testing showed being highly allergic to shellfish. Betadine used in past surgeries w/out problems.//L. Love,RN    Patient Measurements: Height: 5\' 6"  (167.6 cm) Weight: 314 lb (142.429 kg) IBW/kg (Calculated) : 59.3   Vital Signs: Temp: 98.3 F (36.8 C) (01/14 0608) Temp src: Oral (01/14 0608) BP: 109/57 mmHg (01/14 0608) Pulse Rate: 80  (01/14 0608)  Labs:  Basename 04/29/11 0445 04/28/11 0700 04/27/11 0630  HGB -- 11.7* 11.9*  HCT -- 36.7 37.4  PLT -- 212 188  APTT -- -- --  LABPROT 17.6* 16.4* 14.3  INR 1.42 1.30 1.09  HEPARINUNFRC -- -- --  CREATININE -- -- 0.73  CKTOTAL -- -- --  CKMB -- -- --  TROPONINI -- -- --   Estimated Creatinine Clearance: 106.5 ml/min (by C-G formula based on Cr of 0.73).   Medications:  Scheduled:     . ALPRAZolam  1 mg Oral QHS  . Chlorhexidine Gluconate Cloth  6 each Topical Q0600  . ferrous sulfate  325 mg Oral Q breakfast  . mupirocin ointment  1 application Nasal BID  . pantoprazole  40 mg Oral Q1200  . simvastatin  20 mg Oral q1800  . torsemide  20 mg Oral Daily  . warfarin  7.5 mg Oral ONCE-1800  . warfarin   Does not apply Once    Dannielle Huh 04/29/2011,11:24 AM

## 2011-04-29 NOTE — Progress Notes (Signed)
Pt discharged home with family via car.  Pt verbalized understanding of all instructions.  Coumadin prescription called into pharmacy.  [facility] arranged per C.M. For [facility] for coumadin monitoring.

## 2011-04-29 NOTE — Progress Notes (Signed)
Utilization review completed. Bohdi Leeds, RN, BSN. 04/29/11  

## 2011-04-29 NOTE — Progress Notes (Signed)
Subjective: Pt comfortable.  Ready for discharge to home.   Objective: Vital signs in last 24 hours: Temp:  [98.2 F (36.8 C)-98.5 F (36.9 C)] 98.3 F (36.8 C) (01/14 1358) Pulse Rate:  [80-95] 93  (01/14 1358) Resp:  [18-20] 18  (01/14 1358) BP: (104-132)/(57-63) 116/63 mmHg (01/14 1358) SpO2:  [95 %-100 %] 96 % (01/14 1358)  Intake/Output from previous day: 01/13 0701 - 01/14 0700 In: 660 [P.O.:660] Out: -  Intake/Output this shift: Total I/O In: 240 [P.O.:240] Out: 300 [Urine:300]   Basename 04/28/11 0700 04/27/11 0630  HGB 11.7* 11.9*    Basename 04/28/11 0700 04/27/11 0630  WBC 9.1 9.4  RBC 3.93 4.00  HCT 36.7 37.4  PLT 212 188    Basename 04/27/11 0630  NA 134*  K 3.9  CL 98  CO2 28  BUN 8  CREATININE 0.73  GLUCOSE 134*  CALCIUM 8.6    Basename 04/29/11 0445 04/28/11 0700  LABPT -- --  INR 1.42 1.30    Neurovascular intact Intact pulses distally Dorsiflexion/Plantar flexion intact Incision: no drainage  Assessment/Plan: Discharge home with HHPT and coumadin mngmt.  rx percocet, robaxin and coumadin WBAT for ambulation with walker. OV 1 week with Dr Joycelyn Das 04/29/2011, 2:33 PM

## 2011-04-29 NOTE — Progress Notes (Signed)
Physical Therapy Treatment Patient Details Name: [name] MRN: [id number] DOB: Sep 14, 1950 Today's Date: 04/29/2011  PT Assessment/Plan  PT - Assessment/Plan Comments on Treatment Session:  (Patient progressing well and is safe to return home with family. Patient with all recommended DME already at home. Patient ambulating in hallway I'ly with RW. PT Plan: Discharge plan remains appropriate;Frequency remains appropriate PT Frequency: 7X/week Follow Up Recommendations: Home health PT Equipment Recommended:  (has DME at home) PT Goals  Acute Rehab PT Goals PT Goal: Sit to Stand - Progress: Progressing toward goal PT Goal: Stand to Sit - Progress: Progressing toward goal PT Transfer Goal: Bed to Chair/Chair to Bed - Progress: Met PT Goal: Ambulate - Progress: Met PT Goal: Perform Home Exercise Program - Progress: Progressing toward goal Additional Goals PT Goal: Additional Goal #1 - Progress: Progressing toward goal Pain: Patient reports 6/10 R hip pain. RN provided pt with pain meds. PT Treatment Precautions/Restrictions  Precautions Precautions: Posterior Hip Precaution Comments:  (patient provided with cushion to increase seat height to adhere to R hip precautions. Restrictions Weight Bearing Restrictions: Yes RLE Weight Bearing: Weight bearing as tolerated Mobility (including Balance) Bed Mobility Bed Mobility:  (patient received sitting up in chair) Transfers Sit to Stand: With armrests;From chair/3-in-1;5: Supervision Sit to Stand Details (indicate cue type and reason):  (VC to adhere to hip bending precautions, cushion provided to increase seat height  Stand to Sit: 5: Supervision Stand Pivot Transfers: 5: Supervision Ambulation/Gait Ambulation/Gait Assistance: 5: Supervision Ambulation/Gait Assistance Details (indicate cue type and reason):  (patient safe to ambulate on own with RW) Ambulation Distance (Feet): 350 Feet Assistive device: Rolling walker Gait  Pattern: Step-through pattern;Decreased step length - right;Decreased stance time - right;Antalgic Gait velocity: decreased gait speed Stairs:  (patient has no stairs at home to negotiate)  Posture/Postural Control Posture/Postural Control: No significant limitations Exercise  Total Joint Exercises Ankle Circles/Pumps: AROM;20 reps;Seated Hip ABduction/ADduction: AROM;20 reps;Standing Marching in Standing: AROM;10 reps;Standing (with walker, hip flexion < 90 degrees) Standing Hip Extension: AROM;Right;10 reps End of Session PT - End of Session Activity Tolerance: Patient tolerated treatment well Patient left: in chair;with call bell in reach General Behavior During Session: [facility] for tasks performed Cognition: [facility] for tasks performed  Marcene Brawn 04/29/2011, 2:18 PM  Lewis Shock, PT, DPT Pager #: [phone] Office #: [phone]

## 2011-08-15 ENCOUNTER — Other Ambulatory Visit: Payer: Self-pay | Admitting: Family Medicine

## 2011-08-15 DIAGNOSIS — Z1231 Encounter for screening mammogram for malignant neoplasm of breast: Secondary | ICD-10-CM

## 2011-09-03 ENCOUNTER — Ambulatory Visit: Payer: BC Managed Care – PPO

## 2011-09-06 ENCOUNTER — Ambulatory Visit
Admission: RE | Admit: 2011-09-06 | Discharge: 2011-09-06 | Disposition: A | Discharge status: Home / Self Care | Payer: BC Managed Care – PPO | Source: Ambulatory Visit | Attending: Family Medicine | Admitting: Family Medicine

## 2011-09-06 DIAGNOSIS — Z1231 Encounter for screening mammogram for malignant neoplasm of breast: Secondary | ICD-10-CM

## 2012-02-24 ENCOUNTER — Encounter: Payer: Self-pay | Admitting: *Deleted

## 2012-02-24 ENCOUNTER — Encounter: Payer: BC Managed Care – PPO | Attending: Family Medicine | Admitting: *Deleted

## 2012-02-24 VITALS — Ht 66.5 in | Wt 332.3 lb

## 2012-02-24 DIAGNOSIS — R7309 Other abnormal glucose: Secondary | ICD-10-CM

## 2012-02-24 DIAGNOSIS — Z713 Dietary counseling and surveillance: Secondary | ICD-10-CM | POA: Insufficient documentation

## 2012-02-24 DIAGNOSIS — E669 Obesity, unspecified: Secondary | ICD-10-CM | POA: Insufficient documentation

## 2012-02-24 NOTE — Progress Notes (Signed)
Medical Nutrition Therapy:  Appt start time: 0830 end time:  0930.  Assessment:  Prediabetes and weight loss. Patient recently diagnosed with prediabetes with a fasting blood glucose of 126. She expresses desire to lose weight, but has difficulty. She reports that she is vegetarian/vegan eating no meat, seafood, eggs, or milk. She does eat cheese, nuts, and dried beans. Her mobility is limited due to hip replacement. However, she has been walking at the mall with a friend up to 30 minutes 2 times a week. She expresses desire to get a gym membership.   MEDICATIONS: Xanax, aspirin, calcium with D, Nexium, iron, pravastatin   DIETARY INTAKE:   Usual eating pattern includes 3 meals and 0 snacks per day.  24-hr recall:  B ( AM): Slimfast, coffee with cream  Snk ( AM): None  L ( PM): Slimfast, water Snk ( PM): None D ( PM): Banana, cheese, or tomato sandwich, water Snk ( PM): None Beverages: Water, coffee  Usual physical activity: Some walking at work, mall 30 min, working up to 2-3 times a week  Estimated energy needs: 1300 calories 163 g carbohydrates 65 g protein 43 g fat  Progress Towards Goal(s):  In progress.   Nutritional Diagnosis:  NB-1.1 Food and nutrition-related knowledge deficit As related to prediabetes.  As evidenced by no prior education.    Intervention:  Nutrition counseling. We discussed eating a healthy, balanced diet, limiting portion size for weight loss. We also discussed carbohydrate counting, label reading, and meal planning. We also discussed the importance of consuming protein at every meal and ways to meet protein needs on a vegetarian diet.   Goals:  1. 1300 calories daily for 1-2 pounds of weight loss per week.  2. Consume a well balanced diet with carbohydrates, protein, and limited healthy fat at all meals.  3. Choose cheese, nuts/seeds, dried beans for protein 4. Work up to 30 minutes of moderate activity (walking, biking/elliptical at gym) 3 times  weekly.  5. 2-3 carbohydrate portions at meals.   Handouts given during visit include:  Carbohydrate counting booklet  Yellow portions card  1300 calorie meal plan  Monitoring/Evaluation:  Dietary intake, exercise, blood glucose, and body weight prn.

## 2012-02-24 NOTE — Patient Instructions (Signed)
Goals:  1. 1300 calories daily for 1-2 pounds of weight loss per week.  2. Consume a well balanced diet with carbohydrates, protein, and limited healthy fat at all meals.  3. Choose cheese, nuts/seeds, dried beans for protein 4. Work up to 30 minutes of moderate activity (walking, biking/elliptical at gym) 3 times weekly.  5. 2-3 carbohydrate portions at meals.

## 2012-12-08 ENCOUNTER — Other Ambulatory Visit: Payer: Self-pay

## 2012-12-08 DIAGNOSIS — Z1231 Encounter for screening mammogram for malignant neoplasm of breast: Secondary | ICD-10-CM

## 2012-12-22 ENCOUNTER — Other Ambulatory Visit (HOSPITAL_COMMUNITY): Payer: Self-pay | Admitting: Orthopaedic Surgery

## 2012-12-28 ENCOUNTER — Ambulatory Visit
Admission: RE | Admit: 2012-12-28 | Discharge: 2012-12-28 | Disposition: A | Discharge status: Home / Self Care | Payer: 59 | Source: Ambulatory Visit

## 2012-12-28 DIAGNOSIS — Z1231 Encounter for screening mammogram for malignant neoplasm of breast: Secondary | ICD-10-CM

## 2013-01-22 ENCOUNTER — Encounter (HOSPITAL_COMMUNITY): Payer: Self-pay | Admitting: Pharmacy Technician

## 2013-01-25 NOTE — H&P (Signed)
TOTAL KNEE ADMISSION H&P  Patient is being admitted for left total knee arthroplasty.  Subjective:  Chief Complaint:left knee pain.  HPI: [name], 62 y.o. female, has a history of pain and functional disability in the left knee due to arthritis and has failed non-surgical conservative treatments for greater than 12 weeks to includeNSAID's and/or analgesics, corticosteriod injections and activity modification.  Onset of symptoms was gradual, starting 3 years ago with gradually worsening course since that time. The patient noted no past surgery on the left knee(s).  Patient currently rates pain in the left knee(s) at 7 out of 10 with activity. Patient has night pain, worsening of pain with activity and weight bearing and joint swelling.  Patient has evidence of subchondral sclerosis, periarticular osteophytes and joint space narrowing by imaging studies.There is no active infection.  Patient Active Problem List   Diagnosis Date Noted  . 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 Asthma   . Bronchitis   . Pneumonia   . Anemia   . Chronic kidney disease     urinary tract infection  . GERD (gastroesophageal reflux disease)   . H/O hiatal hernia   . Headache   . Arthritis     osteoarthritis  . Anxiety     xanax, 2 tabs per day  . Incontinence     wears pad    Past Surgical History  Procedure Laterality Date  . Appendectomy    . Tubal ligation    . Abdominal hysterectomy      partial  . Bunionectomy      right  . Ankle surgery      with plate on right and left  . Fracture surgery      left wrist fracture repair  with plate  . Hip arthroplasty      right  . Total hip revision  04/26/2011    Procedure: TOTAL HIP REVISION;  Surgeon: Eldred Manges;  Location: [facility] OR;  Service: Orthopedics;  Laterality: Right;  Revision Right Hip to Metal on Poly    No prescriptions prior to admission    Allergies  Allergen Reactions  . Fish Oil   . Shellfish Allergy     Allergy testing showed being highly allergic to shellfish. Betadine used in past surgeries w/out problems.//L. Love,RN  . Vytorin [Ezetimibe-Simvastatin] Anaphylaxis  . Crestor [Rosuvastatin] Hives  . Adhesive [Tape]     If on for too long, will cause rash/itching    History  Substance Use Topics  . Smoking status: Former Smoker    Types: Cigarettes  . Smokeless tobacco: Not on file     Comment: only smoked 1 pack then quit  . Alcohol Use: Yes     Comment: occassional    Family History  Problem Relation Age of Onset  . Anesthesia problems Mother      Review of Systems  Musculoskeletal:       Knee pain left with progressive deformity Previous total hip replacement right with revision.  Last procedure 2011  All other systems reviewed and are negative.    Objective:  Physical Exam  Constitutional: She is oriented to person, place, and time. She appears well-developed and well-nourished.  HENT:  Head: Normocephalic and atraumatic.  Eyes: EOM are normal. Pupils are equal, round, and reactive to light.  Neck: Normal range of motion.  Cardiovascular: Normal rate.  Respiratory: Effort normal.  GI: Soft.  Musculoskeletal:  20 degree valgus deformity of the left knee and 10 degree valgus of the right knee. Laxity of the collateral ligament due to erosive wear on the lateral compartment. + effusion of bilateral knees.  Distal pulses intact.    Neurological: She is alert and oriented to person, place, and time.  Skin: Skin is warm and dry.  Psychiatric: She has a normal mood and affect.    Vital signs in last 24 hours:    Labs:   Estimated body mass index is 52.84 kg/(m^2) as calculated from the following:   Height as of 02/24/12: 5' 6.5" (1.689 m).   Weight as of 02/24/12: 150.73 kg (332 lb 4.8 oz).   Imaging Review Plain radiographs demonstrate severe degenerative joint disease of the left  knee(s). The overall alignment issignificant valgus. The bone quality appears to be adequate for age and reported activity level.  Assessment/Plan:  End stage arthritis, left knee   The patient history, physical examination, clinical judgment of the provider and imaging studies are consistent with end stage degenerative joint disease of the left knee(s) and total knee arthroplasty is deemed medically necessary. The treatment options including medical management, injection therapy arthroscopy and arthroplasty were discussed at length. The risks and benefits of total knee arthroplasty were presented and reviewed. The risks due to aseptic loosening, infection, stiffness, patella tracking problems, thromboembolic complications and other imponderables were discussed. The patient acknowledged the explanation, agreed to proceed with the plan and consent was signed. Patient is being admitted for inpatient treatment for surgery, pain control, PT, OT, prophylactic antibiotics, VTE prophylaxis, progressive ambulation and ADL's and discharge planning. The patient is planning to be discharged home with home health services

## 2013-01-27 ENCOUNTER — Encounter (HOSPITAL_COMMUNITY): Payer: Self-pay

## 2013-01-27 ENCOUNTER — Encounter (HOSPITAL_COMMUNITY)
Admission: RE | Admit: 2013-01-27 | Discharge: 2013-01-27 | Disposition: A | Discharge status: Home / Self Care | Payer: 59 | Source: Ambulatory Visit | Attending: Orthopaedic Surgery | Admitting: Orthopaedic Surgery

## 2013-01-27 ENCOUNTER — Ambulatory Visit (HOSPITAL_COMMUNITY)
Admission: RE | Admit: 2013-01-27 | Discharge: 2013-01-27 | Disposition: A | Discharge status: Home / Self Care | Payer: 59 | Source: Ambulatory Visit | Attending: Orthopaedic Surgery | Admitting: Orthopaedic Surgery

## 2013-01-27 DIAGNOSIS — Z01812 Encounter for preprocedural laboratory examination: Secondary | ICD-10-CM | POA: Insufficient documentation

## 2013-01-27 DIAGNOSIS — Z01810 Encounter for preprocedural cardiovascular examination: Secondary | ICD-10-CM | POA: Insufficient documentation

## 2013-01-27 DIAGNOSIS — R9431 Abnormal electrocardiogram [ECG] [EKG]: Secondary | ICD-10-CM | POA: Insufficient documentation

## 2013-01-27 LAB — SURGICAL PCR SCREEN
MRSA, PCR: NEGATIVE
Staphylococcus aureus: NEGATIVE

## 2013-01-27 LAB — CBC
HCT: 40.6 % (ref 36.0–46.0)
Hemoglobin: 13.5 g/dL (ref 12.0–15.0)
MCH: 30.4 pg (ref 26.0–34.0)
Platelets: 249 10*3/uL (ref 150–400)
RDW: 13.3 % (ref 11.5–15.5)
WBC: 5.7 10*3/uL (ref 4.0–10.5)

## 2013-01-27 LAB — COMPREHENSIVE METABOLIC PANEL
ALT: 20 U/L (ref 0–35)
Albumin: 3.9 g/dL (ref 3.5–5.2)
Alkaline Phosphatase: 84 U/L (ref 39–117)
BUN: 10 mg/dL (ref 6–23)
Chloride: 100 mEq/L (ref 96–112)
Glucose, Bld: 107 mg/dL — ABNORMAL HIGH (ref 70–99)
Potassium: 3.6 mEq/L (ref 3.5–5.1)
Sodium: 139 mEq/L (ref 135–145)
Total Bilirubin: 0.5 mg/dL (ref 0.3–1.2)

## 2013-01-27 LAB — URINALYSIS, ROUTINE W REFLEX MICROSCOPIC
Bilirubin Urine: NEGATIVE
Glucose, UA: NEGATIVE mg/dL
Hgb urine dipstick: NEGATIVE
Ketones, ur: NEGATIVE mg/dL
Specific Gravity, Urine: 1.009 (ref 1.005–1.030)
Urobilinogen, UA: 0.2 mg/dL (ref 0.0–1.0)

## 2013-01-27 LAB — PROTIME-INR: Prothrombin Time: 12.2 seconds (ref 11.6–15.2)

## 2013-01-27 LAB — URINE MICROSCOPIC-ADD ON

## 2013-01-27 LAB — APTT: aPTT: 27 seconds (ref 24–37)

## 2013-01-27 NOTE — Pre-Procedure Instructions (Signed)
[name]  01/27/2013   Your procedure is scheduled on:  Friday, October 24th  Report to Main Entrance "A" and check in with admitting at 0530 AM.  Call this number if you have problems the morning of surgery: [phone]   Remember:   Do not eat food or drink liquids after midnight.   Take these medicines the morning of surgery with A SIP OF WATER: nexium, zyrtec if needed, albuterol if needed  stop taking aspirin, ibuprofen, over the counter vitamins/herbal medications 5 days prior to surgery   Do not wear jewelry, make-up or nail polish.  Do not wear lotions, powders, or perfumes. You may wear deodorant.  Do not shave 48 hours prior to surgery. Men may shave face and neck.  Do not bring valuables to the hospital.  [facility] is not responsible  for any belongings or valuables.               Contacts, dentures or bridgework may not be worn into surgery.  Leave suitcase in the car. After surgery it may be brought to your room.  For patients admitted to the hospital, discharge time is determined by your  treatment team.   Special Instructions: Shower using CHG 2 nights before surgery and the night before surgery.  If you shower the day of surgery use CHG.  Use special wash - you have one bottle of CHG for all showers.  You should use approximately 1/3 of the bottle for each shower.   Please read over the following fact sheets that you were given: Pain Booklet, Coughing and Deep Breathing, Blood Transfusion Information, MRSA Information and Surgical Site Infection Prevention

## 2013-01-28 LAB — TYPE AND SCREEN
ABO/RH(D): O POS
Antibody Screen: NEGATIVE

## 2013-01-28 NOTE — Progress Notes (Addendum)
Anesthesia Chart Review:  Patient is a 62 year old female scheduled for left TKA on 02/05/13 by Dr. Ophelia Charter.  History includes former smoker, asthma, bronchitis, PNA, GERD, hiatal hernia, anxiety, headaches, osteoarthritis, morbid obesity, urinary incontinence (stress?), UTI, anemia, right THA. PCP is listed as Dr. Soledad Gerlach.  EKG on 01/27/13 showed NSR, low voltage QRS, cannot rule out anterior infarct (age undetermined).  It was not felt significantly changed from her prior EKG on 04/25/11.  CXR on 01/27/13 showed no active cardiopulmonary disease.  Preoperative labs noted.  UA was hazy with moderate leukocytes but negative nitrites.  Urine culture added--results pending. CBC, PT/PTT WNL.  Cr 0.79, glucose 107.  She is morbidly obese with history of asthma and bronchitis.  She no longer smokes.  She has no known CAD or DM history.  Her EKG is stable.  If no acute changes then I would anticipate that she could proceed as planned from an anesthesia standpoint.  Velna Ochs [facility] Short Stay Center/Anesthesiology Phone [phone] 01/28/2013 1:09 PM  Addendum: 01/29/2013 3:40 PM Preliminary urine culture showed > 100,000 gram negative rods.  Results called to [facility] at Dr. Bonnita Hollow office.  He is in clinic today, and she will have him review for recommendations.  (Called final urine culture report of Klebsiella Pneumoniae to [place] on 02/01/13 at 9:28 AM.)

## 2013-01-30 LAB — URINE CULTURE: Colony Count: >=100000

## 2013-02-04 MED ORDER — DEXTROSE 5 % IV SOLN
3.0000 g | INTRAVENOUS | Status: AC
  Administered 2013-02-05: 3 g via INTRAVENOUS
  Filled 2013-02-04: qty 3000

## 2013-02-05 ENCOUNTER — Inpatient Hospital Stay (HOSPITAL_COMMUNITY)
Admission: RE | Admit: 2013-02-05 | Discharge: 2013-02-08 | DRG: 470 | Disposition: A | Discharge status: Home Health | Payer: 59 | Source: Ambulatory Visit | Attending: Orthopaedic Surgery | Admitting: Orthopaedic Surgery

## 2013-02-05 ENCOUNTER — Encounter (HOSPITAL_COMMUNITY): Payer: 59 | Admitting: Vascular Surgery

## 2013-02-05 ENCOUNTER — Inpatient Hospital Stay (HOSPITAL_COMMUNITY): Payer: 59 | Admitting: Certified Registered"

## 2013-02-05 ENCOUNTER — Encounter (HOSPITAL_COMMUNITY)
Admission: RE | Disposition: A | Discharge status: Home Health | Payer: Self-pay | Source: Ambulatory Visit | Attending: Orthopaedic Surgery

## 2013-02-05 ENCOUNTER — Encounter (HOSPITAL_COMMUNITY): Payer: Self-pay | Admitting: *Deleted

## 2013-02-05 DIAGNOSIS — K219 Gastro-esophageal reflux disease without esophagitis: Secondary | ICD-10-CM | POA: Diagnosis present

## 2013-02-05 DIAGNOSIS — Z91013 Allergy to seafood: Secondary | ICD-10-CM

## 2013-02-05 DIAGNOSIS — M1711 Unilateral primary osteoarthritis, right knee: Secondary | ICD-10-CM | POA: Diagnosis present

## 2013-02-05 DIAGNOSIS — F411 Generalized anxiety disorder: Secondary | ICD-10-CM | POA: Diagnosis present

## 2013-02-05 DIAGNOSIS — M1712 Unilateral primary osteoarthritis, left knee: Secondary | ICD-10-CM

## 2013-02-05 DIAGNOSIS — Z9089 Acquired absence of other organs: Secondary | ICD-10-CM

## 2013-02-05 DIAGNOSIS — E669 Obesity, unspecified: Secondary | ICD-10-CM | POA: Diagnosis present

## 2013-02-05 DIAGNOSIS — Z6841 Body Mass Index (BMI) 40.0 and over, adult: Secondary | ICD-10-CM

## 2013-02-05 DIAGNOSIS — Z96649 Presence of unspecified artificial hip joint: Secondary | ICD-10-CM

## 2013-02-05 DIAGNOSIS — Z87891 Personal history of nicotine dependence: Secondary | ICD-10-CM

## 2013-02-05 DIAGNOSIS — Z9851 Tubal ligation status: Secondary | ICD-10-CM

## 2013-02-05 DIAGNOSIS — M1710 Unilateral primary osteoarthritis, unspecified knee: Principal | ICD-10-CM | POA: Diagnosis present

## 2013-02-05 DIAGNOSIS — M9420 Chondromalacia, unspecified site: Secondary | ICD-10-CM | POA: Diagnosis present

## 2013-02-05 DIAGNOSIS — J45909 Unspecified asthma, uncomplicated: Secondary | ICD-10-CM | POA: Diagnosis present

## 2013-02-05 HISTORY — PX: KNEE ARTHROPLASTY: SHX992

## 2013-02-05 SURGERY — ARTHROPLASTY, KNEE, TOTAL, USING IMAGELESS COMPUTER-ASSISTED NAVIGATION
Anesthesia: General | Site: Knee | Laterality: Left | Wound class: Clean

## 2013-02-05 MED ORDER — ALBUTEROL SULFATE HFA 108 (90 BASE) MCG/ACT IN AERS
1.0000 | INHALATION_SPRAY | Freq: Four times a day (QID) | RESPIRATORY_TRACT | Status: DC | PRN
  Filled 2013-02-05: qty 6.7

## 2013-02-05 MED ORDER — PRAVASTATIN SODIUM 40 MG PO TABS
40.0000 mg | ORAL_TABLET | Freq: Every day | ORAL | Status: DC
  Administered 2013-02-05 – 2013-02-07 (×3): 40 mg via ORAL
  Filled 2013-02-05 (×4): qty 1

## 2013-02-05 MED ORDER — ACETAMINOPHEN 325 MG PO TABS: 650.0000 mg | ORAL_TABLET | Freq: Four times a day (QID) | ORAL | Status: DC | PRN

## 2013-02-05 MED ORDER — DOCUSATE SODIUM 100 MG PO CAPS
100.0000 mg | ORAL_CAPSULE | Freq: Two times a day (BID) | ORAL | Status: DC
  Administered 2013-02-05 – 2013-02-08 (×7): 100 mg via ORAL
  Filled 2013-02-05 (×8): qty 1

## 2013-02-05 MED ORDER — LIDOCAINE HCL (CARDIAC) 20 MG/ML IV SOLN
INTRAVENOUS | Status: DC | PRN
  Administered 2013-02-05: 80 mg via INTRAVENOUS

## 2013-02-05 MED ORDER — OXYCODONE-ACETAMINOPHEN 5-325 MG PO TABS: 1.0000 | ORAL_TABLET | ORAL | Status: AC | PRN

## 2013-02-05 MED ORDER — KETOROLAC TROMETHAMINE 15 MG/ML IJ SOLN
15.0000 mg | Freq: Four times a day (QID) | INTRAMUSCULAR | Status: AC
  Administered 2013-02-05 – 2013-02-06 (×6): 15 mg via INTRAVENOUS
  Filled 2013-02-05 (×6): qty 1

## 2013-02-05 MED ORDER — SENNOSIDES-DOCUSATE SODIUM 8.6-50 MG PO TABS: 1.0000 | ORAL_TABLET | Freq: Every evening | ORAL | Status: DC | PRN

## 2013-02-05 MED ORDER — METHOCARBAMOL 100 MG/ML IJ SOLN
500.0000 mg | Freq: Four times a day (QID) | INTRAVENOUS | Status: DC | PRN
  Filled 2013-02-05: qty 5

## 2013-02-05 MED ORDER — METHOCARBAMOL 500 MG PO TABS
ORAL_TABLET | ORAL | Status: AC
  Filled 2013-02-05: qty 1

## 2013-02-05 MED ORDER — OXYCODONE HCL 5 MG PO TABS
5.0000 mg | ORAL_TABLET | ORAL | Status: DC | PRN
  Administered 2013-02-06 – 2013-02-08 (×7): 10 mg via ORAL
  Filled 2013-02-05 (×7): qty 2

## 2013-02-05 MED ORDER — PHENOL 1.4 % MT LIQD: 1.0000 | OROMUCOSAL | Status: DC | PRN

## 2013-02-05 MED ORDER — KCL IN DEXTROSE-NACL 20-5-0.45 MEQ/L-%-% IV SOLN
INTRAVENOUS | Status: AC
  Filled 2013-02-05: qty 1000

## 2013-02-05 MED ORDER — FLEET ENEMA 7-19 GM/118ML RE ENEM: 1.0000 | ENEMA | Freq: Once | RECTAL | Status: AC | PRN

## 2013-02-05 MED ORDER — ONDANSETRON HCL 4 MG/2ML IJ SOLN: 4.0000 mg | Freq: Once | INTRAMUSCULAR | Status: DC | PRN

## 2013-02-05 MED ORDER — SODIUM CHLORIDE 0.9 % IR SOLN
Status: DC | PRN
  Administered 2013-02-05: 1000 mL
  Administered 2013-02-05: 3000 mL

## 2013-02-05 MED ORDER — ALUM & MAG HYDROXIDE-SIMETH 200-200-20 MG/5ML PO SUSP: 30.0000 mL | ORAL | Status: DC | PRN

## 2013-02-05 MED ORDER — ALPRAZOLAM ER 0.5 MG PO TB24: 0.5000 mg | ORAL_TABLET | Freq: Every evening | ORAL | Status: DC | PRN

## 2013-02-05 MED ORDER — ASPIRIN EC 325 MG PO TBEC: 325.0000 mg | DELAYED_RELEASE_TABLET | Freq: Every day | ORAL | Status: AC

## 2013-02-05 MED ORDER — LACTATED RINGERS IV SOLN
INTRAVENOUS | Status: DC | PRN
  Administered 2013-02-05 (×2): via INTRAVENOUS

## 2013-02-05 MED ORDER — MORPHINE SULFATE 2 MG/ML IJ SOLN
2.0000 mg | INTRAMUSCULAR | Status: DC | PRN
  Administered 2013-02-05 – 2013-02-06 (×3): 2 mg via INTRAVENOUS
  Filled 2013-02-05 (×3): qty 1

## 2013-02-05 MED ORDER — METOCLOPRAMIDE HCL 5 MG/ML IJ SOLN: 5.0000 mg | Freq: Three times a day (TID) | INTRAMUSCULAR | Status: DC | PRN

## 2013-02-05 MED ORDER — KETOROLAC TROMETHAMINE 30 MG/ML IJ SOLN
INTRAMUSCULAR | Status: AC
  Administered 2013-02-05: 15 mg
  Filled 2013-02-05: qty 1

## 2013-02-05 MED ORDER — HYDROMORPHONE HCL PF 1 MG/ML IJ SOLN
INTRAMUSCULAR | Status: AC
  Filled 2013-02-05: qty 1

## 2013-02-05 MED ORDER — FERROUS SULFATE 325 (65 FE) MG PO TABS
325.0000 mg | ORAL_TABLET | Freq: Every day | ORAL | Status: DC
  Administered 2013-02-06 – 2013-02-08 (×3): 325 mg via ORAL
  Filled 2013-02-05 (×4): qty 1

## 2013-02-05 MED ORDER — HYDROMORPHONE HCL PF 1 MG/ML IJ SOLN
0.2500 mg | INTRAMUSCULAR | Status: DC | PRN
  Administered 2013-02-05 (×4): 0.5 mg via INTRAVENOUS

## 2013-02-05 MED ORDER — FENTANYL CITRATE 0.05 MG/ML IJ SOLN
INTRAMUSCULAR | Status: DC | PRN
  Administered 2013-02-05 (×4): 50 ug via INTRAVENOUS
  Administered 2013-02-05: 150 ug via INTRAVENOUS
  Administered 2013-02-05 (×2): 50 ug via INTRAVENOUS

## 2013-02-05 MED ORDER — ONDANSETRON HCL 4 MG PO TABS: 4.0000 mg | ORAL_TABLET | Freq: Four times a day (QID) | ORAL | Status: DC | PRN

## 2013-02-05 MED ORDER — MENTHOL 3 MG MT LOZG: 1.0000 | LOZENGE | OROMUCOSAL | Status: DC | PRN

## 2013-02-05 MED ORDER — ARTIFICIAL TEARS OP OINT
TOPICAL_OINTMENT | OPHTHALMIC | Status: DC | PRN
  Administered 2013-02-05: 1 via OPHTHALMIC

## 2013-02-05 MED ORDER — PROPOFOL 10 MG/ML IV BOLUS
INTRAVENOUS | Status: DC | PRN
  Administered 2013-02-05: 300 mg via INTRAVENOUS

## 2013-02-05 MED ORDER — ALBUTEROL SULFATE HFA 108 (90 BASE) MCG/ACT IN AERS
INHALATION_SPRAY | RESPIRATORY_TRACT | Status: DC | PRN
  Administered 2013-02-05 (×2): 2 via RESPIRATORY_TRACT

## 2013-02-05 MED ORDER — METHOCARBAMOL 500 MG PO TABS
500.0000 mg | ORAL_TABLET | Freq: Four times a day (QID) | ORAL | Status: DC | PRN
  Administered 2013-02-05 – 2013-02-08 (×7): 500 mg via ORAL
  Filled 2013-02-05 (×7): qty 1

## 2013-02-05 MED ORDER — KCL IN DEXTROSE-NACL 20-5-0.45 MEQ/L-%-% IV SOLN
INTRAVENOUS | Status: DC
  Administered 2013-02-05: 12:00:00 via INTRAVENOUS
  Filled 2013-02-05 (×7): qty 1000

## 2013-02-05 MED ORDER — ONDANSETRON HCL 4 MG/2ML IJ SOLN: 4.0000 mg | Freq: Four times a day (QID) | INTRAMUSCULAR | Status: DC | PRN

## 2013-02-05 MED ORDER — DIPHENHYDRAMINE HCL 12.5 MG/5ML PO ELIX
12.5000 mg | ORAL_SOLUTION | ORAL | Status: DC | PRN
  Administered 2013-02-05 – 2013-02-06 (×2): 25 mg via ORAL
  Filled 2013-02-05 (×2): qty 10

## 2013-02-05 MED ORDER — ACETAMINOPHEN 650 MG RE SUPP: 650.0000 mg | Freq: Four times a day (QID) | RECTAL | Status: DC | PRN

## 2013-02-05 MED ORDER — LIDOCAINE HCL 4 % MT SOLN
OROMUCOSAL | Status: DC | PRN
  Administered 2013-02-05: 4 mL via TOPICAL

## 2013-02-05 MED ORDER — GLYCOPYRROLATE 0.2 MG/ML IJ SOLN
INTRAMUSCULAR | Status: DC | PRN
  Administered 2013-02-05: 0.6 mg via INTRAVENOUS

## 2013-02-05 MED ORDER — ROCURONIUM BROMIDE 100 MG/10ML IV SOLN
INTRAVENOUS | Status: DC | PRN
  Administered 2013-02-05: 35 mg via INTRAVENOUS

## 2013-02-05 MED ORDER — SUCCINYLCHOLINE CHLORIDE 20 MG/ML IJ SOLN
INTRAMUSCULAR | Status: DC | PRN
  Administered 2013-02-05: 100 mg via INTRAVENOUS

## 2013-02-05 MED ORDER — LORATADINE 10 MG PO TABS
10.0000 mg | ORAL_TABLET | Freq: Every day | ORAL | Status: DC
  Administered 2013-02-06 – 2013-02-08 (×3): 10 mg via ORAL
  Filled 2013-02-05 (×3): qty 1

## 2013-02-05 MED ORDER — NEOSTIGMINE METHYLSULFATE 1 MG/ML IJ SOLN
INTRAMUSCULAR | Status: DC | PRN
  Administered 2013-02-05: 4 mg via INTRAVENOUS

## 2013-02-05 MED ORDER — ONDANSETRON HCL 4 MG/2ML IJ SOLN
INTRAMUSCULAR | Status: DC | PRN
  Administered 2013-02-05: 4 mg via INTRAVENOUS

## 2013-02-05 MED ORDER — METOCLOPRAMIDE HCL 10 MG PO TABS: 5.0000 mg | ORAL_TABLET | Freq: Three times a day (TID) | ORAL | Status: DC | PRN

## 2013-02-05 MED ORDER — TORSEMIDE 20 MG PO TABS
20.0000 mg | ORAL_TABLET | Freq: Every day | ORAL | Status: DC
  Administered 2013-02-05 – 2013-02-08 (×2): 20 mg via ORAL
  Filled 2013-02-05 (×4): qty 1

## 2013-02-05 MED ORDER — ALPRAZOLAM 0.5 MG PO TABS: 0.5000 mg | ORAL_TABLET | Freq: Every evening | ORAL | Status: DC | PRN

## 2013-02-05 MED ORDER — IRON 325 (65 FE) MG PO TABS: 325.0000 mg | ORAL_TABLET | Freq: Every day | ORAL | Status: DC

## 2013-02-05 MED ORDER — BISACODYL 10 MG RE SUPP: 10.0000 mg | Freq: Every day | RECTAL | Status: DC | PRN

## 2013-02-05 MED ORDER — PANTOPRAZOLE SODIUM 40 MG PO TBEC
80.0000 mg | DELAYED_RELEASE_TABLET | Freq: Every day | ORAL | Status: DC
  Administered 2013-02-06 – 2013-02-07 (×2): 80 mg via ORAL
  Filled 2013-02-05 (×3): qty 2

## 2013-02-05 MED ORDER — SIMVASTATIN 20 MG PO TABS: 20.0000 mg | ORAL_TABLET | Freq: Every day | ORAL | Status: DC

## 2013-02-05 MED ORDER — CEFAZOLIN SODIUM-DEXTROSE 2-3 GM-% IV SOLR
2.0000 g | Freq: Four times a day (QID) | INTRAVENOUS | Status: AC
  Administered 2013-02-05: 2 g via INTRAVENOUS
  Filled 2013-02-05: qty 50

## 2013-02-05 MED ORDER — METHOCARBAMOL 500 MG PO TABS: 500.0000 mg | ORAL_TABLET | Freq: Four times a day (QID) | ORAL | Status: AC | PRN

## 2013-02-05 MED ORDER — ZOLPIDEM TARTRATE 5 MG PO TABS: 5.0000 mg | ORAL_TABLET | Freq: Every evening | ORAL | Status: DC | PRN

## 2013-02-05 MED ORDER — ASPIRIN EC 325 MG PO TBEC
325.0000 mg | DELAYED_RELEASE_TABLET | Freq: Every day | ORAL | Status: DC
  Administered 2013-02-06 – 2013-02-08 (×3): 325 mg via ORAL
  Filled 2013-02-05 (×4): qty 1

## 2013-02-05 SURGICAL SUPPLY — 68 items
BANDAGE ELASTIC 4 VELCRO ST LF (GAUZE/BANDAGES/DRESSINGS) ×2 IMPLANT
BANDAGE ESMARK 6X9 LF (GAUZE/BANDAGES/DRESSINGS) ×1 IMPLANT
BENZOIN TINCTURE PRP APPL 2/3 (GAUZE/BANDAGES/DRESSINGS) ×2 IMPLANT
BLADE SAGITTAL 25.0X1.19X90 (BLADE) ×2 IMPLANT
BLADE SAW SGTL 13X75X1.27 (BLADE) ×2 IMPLANT
BNDG ELASTIC 6X10 VLCR STRL LF (GAUZE/BANDAGES/DRESSINGS) ×2 IMPLANT
BNDG ESMARK 6X9 LF (GAUZE/BANDAGES/DRESSINGS) ×2
BOWL SMART MIX CTS (DISPOSABLE) ×2 IMPLANT
CAPT RP KNEE ×2 IMPLANT
CEMENT HV SMART SET (Cement) ×4 IMPLANT
CLOTH BEACON ORANGE TIMEOUT ST (SAFETY) ×2 IMPLANT
CLSR STERI-STRIP ANTIMIC 1/2X4 (GAUZE/BANDAGES/DRESSINGS) ×2 IMPLANT
COVER BACK TABLE 24X17X13 BIG (DRAPES) IMPLANT
COVER SURGICAL LIGHT HANDLE (MISCELLANEOUS) ×2 IMPLANT
CUFF TOURNIQUET SINGLE 34IN LL (TOURNIQUET CUFF) IMPLANT
CUFF TOURNIQUET SINGLE 44IN (TOURNIQUET CUFF) ×2 IMPLANT
DRAPE ORTHO SPLIT 77X108 STRL (DRAPES) ×2
DRAPE SURG ORHT 6 SPLT 77X108 (DRAPES) ×2 IMPLANT
DRAPE U-SHAPE 47X51 STRL (DRAPES) ×2 IMPLANT
DRSG PAD ABDOMINAL 8X10 ST (GAUZE/BANDAGES/DRESSINGS) ×4 IMPLANT
DURAPREP 26ML APPLICATOR (WOUND CARE) ×2 IMPLANT
ELECT REM PT RETURN 9FT ADLT (ELECTROSURGICAL) ×2
ELECTRODE REM PT RTRN 9FT ADLT (ELECTROSURGICAL) ×1 IMPLANT
FACESHIELD LNG OPTICON STERILE (SAFETY) ×2 IMPLANT
GAUZE XEROFORM 5X9 LF (GAUZE/BANDAGES/DRESSINGS) ×2 IMPLANT
GLOVE BIO SURGEON STRL SZ8.5 (GLOVE) ×2 IMPLANT
GLOVE BIOGEL PI IND STRL 7.5 (GLOVE) ×1 IMPLANT
GLOVE BIOGEL PI IND STRL 8 (GLOVE) ×1 IMPLANT
GLOVE BIOGEL PI INDICATOR 7.5 (GLOVE) ×1
GLOVE BIOGEL PI INDICATOR 8 (GLOVE) ×1
GLOVE ECLIPSE 7.0 STRL STRAW (GLOVE) ×2 IMPLANT
GLOVE ORTHO TXT STRL SZ7.5 (GLOVE) ×4 IMPLANT
GLOVE SURG SS PI 8.5 STRL IVOR (GLOVE) ×1
GLOVE SURG SS PI 8.5 STRL STRW (GLOVE) ×1 IMPLANT
GOWN PREVENTION PLUS LG XLONG (DISPOSABLE) ×2 IMPLANT
GOWN PREVENTION PLUS XLARGE (GOWN DISPOSABLE) IMPLANT
GOWN STRL NON-REIN LRG LVL3 (GOWN DISPOSABLE) ×2 IMPLANT
GOWN STRL REIN XL XLG (GOWN DISPOSABLE) ×2 IMPLANT
HANDPIECE INTERPULSE COAX TIP (DISPOSABLE) ×1
IMMOBILIZER KNEE 22 UNIV (SOFTGOODS) ×2 IMPLANT
KIT BASIN OR (CUSTOM PROCEDURE TRAY) ×2 IMPLANT
KIT ROOM TURNOVER OR (KITS) ×2 IMPLANT
MANIFOLD NEPTUNE II (INSTRUMENTS) ×2 IMPLANT
MARKER SPHERE PSV REFLC THRD 5 (MARKER) ×6 IMPLANT
NEEDLE 1/2 CIR MAYO (NEEDLE) ×2 IMPLANT
NEEDLE HYPO 25GX1X1/2 BEV (NEEDLE) ×2 IMPLANT
NS IRRIG 1000ML POUR BTL (IV SOLUTION) ×2 IMPLANT
PACK TOTAL JOINT (CUSTOM PROCEDURE TRAY) ×2 IMPLANT
PAD ARMBOARD 7.5X6 YLW CONV (MISCELLANEOUS) ×4 IMPLANT
PAD CAST 4YDX4 CTTN HI CHSV (CAST SUPPLIES) ×1 IMPLANT
PADDING CAST COTTON 4X4 STRL (CAST SUPPLIES) ×1
PADDING CAST COTTON 6X4 STRL (CAST SUPPLIES) ×2 IMPLANT
PIN SCHANZ 4MM 130MM (PIN) ×8 IMPLANT
SET HNDPC FAN SPRY TIP SCT (DISPOSABLE) ×1 IMPLANT
SPONGE GAUZE 4X4 12PLY (GAUZE/BANDAGES/DRESSINGS) ×2 IMPLANT
STAPLER VISISTAT 35W (STAPLE) ×2 IMPLANT
STRIP CLOSURE SKIN 1/2X4 (GAUZE/BANDAGES/DRESSINGS) ×4 IMPLANT
SUCTION FRAZIER TIP 10 FR DISP (SUCTIONS) ×2 IMPLANT
SUT ETHIBOND NAB CT1 #1 30IN (SUTURE) ×4 IMPLANT
SUT VIC AB 2-0 CT1 27 (SUTURE) ×2
SUT VIC AB 2-0 CT1 TAPERPNT 27 (SUTURE) ×2 IMPLANT
SUT VICRYL 4-0 PS2 18IN ABS (SUTURE) ×2 IMPLANT
SUT VICRYL AB 2 0 TIES (SUTURE) IMPLANT
SYR CONTROL 10ML LL (SYRINGE) IMPLANT
TOWEL OR 17X24 6PK STRL BLUE (TOWEL DISPOSABLE) ×2 IMPLANT
TOWEL OR 17X26 10 PK STRL BLUE (TOWEL DISPOSABLE) ×2 IMPLANT
TRAY FOLEY CATH 16FRSI W/METER (SET/KITS/TRAYS/PACK) ×2 IMPLANT
WATER STERILE IRR 1000ML POUR (IV SOLUTION) ×4 IMPLANT

## 2013-02-05 NOTE — Addendum Note (Signed)
Addendum created 02/05/13 1125 by Lanell Matar, CRNA   Modules edited: Anesthesia Medication Administration

## 2013-02-05 NOTE — Brief Op Note (Cosign Needed)
02/05/2013  10:08 AM  PATIENT:  [name]  62 y.o. female  PRE-OPERATIVE DIAGNOSIS:  Osteoarthritis Left Knee  POST-OPERATIVE DIAGNOSIS:  Osteoarthritis Left Knee  PROCEDURE:  Procedure(s) with comments: COMPUTER ASSISTED TOTAL KNEE ARTHROPLASTY- left (Left) - Left Cemented Total Knee Arthroplasty, Computer Assist  SURGEON:  Surgeon(s) and Role:    * Eldred Manges, MD - Primary  PHYSICIAN ASSISTANT: Maud Deed [facility]  ASSISTANTS: none   ANESTHESIA:   general  EBL:  Total I/O In: 1500 [I.V.:1500] Out: 70 [Urine:70]  BLOOD ADMINISTERED:none  DRAINS: none   LOCAL MEDICATIONS USED:  NONE  SPECIMEN:  No Specimen  DISPOSITION OF SPECIMEN:  N/A  COUNTS:  YES  TOURNIQUET:   Total Tourniquet Time Documented: Thigh (Left) - 67 minutes Total: Thigh (Left) - 67 minutes   DICTATION: .Note written in EPIC  PLAN OF CARE: Admit to inpatient   PATIENT DISPOSITION:  PACU - hemodynamically stable.   Delay start of Pharmacological VTE agent (>24hrs) due to surgical blood loss or risk of bleeding: no

## 2013-02-05 NOTE — Interval H&P Note (Signed)
History and Physical Interval Note:  02/05/2013 7:20 AM  [name]  has presented today for surgery, with the diagnosis of Osteoarthritis Left Knee  The various methods of treatment have been discussed with the patient and family. After consideration of risks, benefits and other options for treatment, the patient has consented to  Procedure(s) with comments: COMPUTER ASSISTED TOTAL KNEE ARTHROPLASTY (Left) - Left Cemented Total Knee Arthroplasty, Computer Assist as a surgical intervention .  The patient's history has been reviewed, patient examined, no change in status, stable for surgery.  I have reviewed the patient's chart and labs.  Questions were answered to the patient's satisfaction.     Arshad Oberholzer C

## 2013-02-05 NOTE — Care Management Utilization Note (Signed)
Utilization review completed. Clayvon Parlett, RN BSN 

## 2013-02-05 NOTE — Anesthesia Preprocedure Evaluation (Addendum)
Anesthesia Evaluation  Patient identified by MRN, date of birth, ID band Patient awake    Reviewed: Allergy & Precautions, H&P , NPO status , Patient's Chart, lab work & pertinent test results  Airway Mallampati: III TM Distance: >3 FB Neck ROM: Full    Dental  (+) Teeth Intact and Dental Advisory Given   Pulmonary asthma ,          Cardiovascular     Neuro/Psych  Headaches, Anxiety    GI/Hepatic hiatal hernia, GERD-  ,  Endo/Other    Renal/GU Renal disease     Musculoskeletal   Abdominal   Peds  Hematology   Anesthesia Other Findings   Reproductive/Obstetrics                          Anesthesia Physical Anesthesia Plan  ASA: II  Anesthesia Plan: General   Post-op Pain Management:    Induction: Intravenous  Airway Management Planned: Oral ETT  Additional Equipment:   Intra-op Plan:   Post-operative Plan: Extubation in OR  Informed Consent: I have reviewed the patients History and Physical, chart, labs and discussed the procedure including the risks, benefits and alternatives for the proposed anesthesia with the patient or authorized representative who has indicated his/her understanding and acceptance.     Plan Discussed with: CRNA, Anesthesiologist and Surgeon  Anesthesia Plan Comments:        Anesthesia Quick Evaluation

## 2013-02-05 NOTE — Anesthesia Postprocedure Evaluation (Signed)
  Anesthesia Post-op Note  Patient: [name]  Procedure(s) Performed: Procedure(s) with comments: COMPUTER ASSISTED TOTAL KNEE ARTHROPLASTY- left (Left) - Left Cemented Total Knee Arthroplasty, Computer Assist  Patient Location: PACU  Anesthesia Type:General  Level of Consciousness: awake, alert , oriented and patient cooperative  Airway and Oxygen Therapy: Patient Spontanous Breathing  Post-op Pain: moderate  Post-op Assessment: Post-op Vital signs reviewed, Patient's Cardiovascular Status Stable, Respiratory Function Stable, Patent Airway, No signs of Nausea or vomiting and Pain level controlled  Post-op Vital Signs: stable  Complications: No apparent anesthesia complications

## 2013-02-05 NOTE — Evaluation (Signed)
Physical Therapy Evaluation Patient Details Name: NEIDA ELLEGOOD MRN: [id number] DOB: [birth year] Today's Date: 02/05/2013 Time: 1532-1600 PT Time Calculation (min): 28 min  PT Assessment / Plan / Recommendation History of Present Illness  Patient is a 62 yo female s/p Lt TKA.  Clinical Impression  Patient presents with problems listed below.  Will benefit from acute PT to maximize independence prior to discharge home with family.    PT Assessment  Patient needs continued PT services    Follow Up Recommendations  Home health PT;Supervision/Assistance - 24 hour    Does the patient have the potential to tolerate intense rehabilitation      Barriers to Discharge        Equipment Recommendations  None recommended by PT    Recommendations for Other Services     Frequency 7X/week    Precautions / Restrictions Precautions Precautions: Knee;Fall Precaution Booklet Issued: Yes (comment) Precaution Comments: Reviewed precautions with patient and son Required Braces or Orthoses: Knee Immobilizer - Left Knee Immobilizer - Left: On when out of bed or walking;Discontinue once straight leg raise with < 10 degree lag Restrictions Weight Bearing Restrictions: Yes LLE Weight Bearing: Weight bearing as tolerated   Pertinent Vitals/Pain Pain 6/10      Mobility  Bed Mobility Bed Mobility: Supine to Sit;Sitting - Scoot to Edge of Bed Supine to Sit: 3: Mod assist;With rails;HOB elevated Sitting - Scoot to Edge of Bed: 4: Min assist;With rail Details for Bed Mobility Assistance: Instructed patient and granddaughter on donning KI on LLE.  Verbal cues for technique.  Assist to manage LLE and to raise trunk to sitting.  Patient reports she sleeps in recliner at home (cannot lay flat in bed). Transfers Transfers: Sit to Stand;Stand to Sit;Stand Pivot Transfers Sit to Stand: 3: Mod assist;From elevated surface;With upper extremity assist;From bed Stand to Sit: 4: Min assist;With upper  extremity assist;With armrests;To chair/3-in-1 Stand Pivot Transfers: 3: Mod assist Details for Transfer Assistance: Verbal cues for hand placement.  Elevated bed to assist with transfer.  Assist to rise to standing and for balance initially.  Patient able to take a few steps to pivot to chair. Ambulation/Gait Ambulation/Gait Assistance: Not tested (comment)    Exercises Total Joint Exercises Ankle Circles/Pumps: AROM;Both;10 reps;Seated   PT Diagnosis: Difficulty walking;Acute pain  PT Problem List: Decreased strength;Decreased range of motion;Decreased activity tolerance;Decreased balance;Decreased mobility;Decreased knowledge of use of DME;Decreased knowledge of precautions;Obesity;Pain PT Treatment Interventions: DME instruction;Gait training;Functional mobility training;Therapeutic exercise;Patient/family education     PT Goals(Current goals can be found in the care plan section) Acute Rehab PT Goals Patient Stated Goal: to return home PT Goal Formulation: With patient/family Time For Goal Achievement: 02/12/13 Potential to Achieve Goals: Good  Visit Information  Last PT Received On: 02/05/13 Assistance Needed: +1 History of Present Illness: Patient is a 62 yo female s/p Lt TKA.       Prior Functioning  Home Living Family/patient expects to be discharged to:: Private residence Living Arrangements: Children Available Help at Discharge: Family;Available 24 hours/day Type of Home: House Home Access: Ramped entrance Home Layout: One level Home Equipment: Walker - 2 wheels;Shower seat - built in;Bedside commode;Cane - single point Prior Function Level of Independence: Independent with assistive device(s) Comments: Patient using cane ptal Communication Communication: No difficulties    Cognition  Cognition Arousal/Alertness: Awake/alert Behavior During Therapy: WFL for tasks assessed/performed Overall Cognitive Status: Within Functional Limits for tasks assessed     Extremity/Trunk Assessment Upper Extremity Assessment Upper Extremity Assessment: Overall  WFL for tasks assessed Lower Extremity Assessment Lower Extremity Assessment: LLE deficits/detail LLE Deficits / Details: Decreased strength and ROM due to surgery and pain. LLE: Unable to fully assess due to pain LLE Coordination: decreased gross motor   Balance Balance Balance Assessed: Yes Static Sitting Balance Static Sitting - Balance Support: Right upper extremity supported;Feet supported Static Sitting - Level of Assistance: 5: Stand by assistance Static Sitting - Comment/# of Minutes: 6 minutes.  Patient with good balance.  Patient initially with lightheadedness - resolved with time. Static Standing Balance Static Standing - Balance Support: Bilateral upper extremity supported Static Standing - Level of Assistance: 4: Min assist Static Standing - Comment/# of Minutes: 2 minutes.  End of Session PT - End of Session Equipment Utilized During Treatment: Gait belt;Left knee immobilizer;Oxygen Activity Tolerance: Patient limited by pain;Patient limited by fatigue Patient left: in chair;with call bell/phone within reach;with family/visitor present Nurse Communication: Mobility status CPM Left Knee CPM Left Knee: Off  GP     Vena Austria 02/05/2013, 4:23 PM Durenda Hurt. Renaldo Fiddler, [facility] Acute Rehab Services Pager [phone]

## 2013-02-05 NOTE — Op Note (Signed)
[name], [name]               ACCOUNT NO.:  [id number]  MEDICAL RECORD NO.:  [id number]  LOCATION:  [facility]                         FACILITY:  [facility]  PHYSICIAN:  Mark C. Ophelia Charter, M.D.    DATE OF BIRTH:  October 01, 1950  DATE OF PROCEDURE:  02/05/2013 DATE OF DISCHARGE:                              OPERATIVE REPORT   PREOPERATIVE DIAGNOSIS:  Left knee osteoarthritis.  POSTOPERATIVE DIAGNOSES:  Left knee osteoarthritis.  PROCEDURE:  Left cemented total knee arthroplasty, computer assist.  SURGEON:  Mark C. Ophelia Charter, MD  ASSISTANT:  Maud Deed, PA-C, medically necessary and present for the entire procedure.  TOURNIQUET TIME:  1 hour and 7 minutes.  ANESTHESIA:  GOT plus preoperative femoral nerve block.  ESTIMATED BLOOD LOSS:  Less than 100 mL.  COMPONENTS:  [employer], LCS rotating platform #4 standard femur, 10-mm polyethylene, #3 cemented tibia, and 38 mm 3 pegged patella.  The femur is with lugs.  PROCEDURE IN DETAIL:  After induction of preoperative femoral nerve block, Foley catheter insertion.  After induction of general anesthesia, oral tracheal intubation.  Proximal thigh tourniquet lateral posterior bump, prepped with DuraPrep using impervious stockinette, split sheets, drapes, sterile skin marker, Betadine Steri-Drapes was applied.  Time- out procedure was completed.  Leg was elevated, wrapped with an Esmarch and tourniquet inflated at 400.  The patient received 3 g Ancef prophylactically.  The patient had a BMI greater than 50.  Midline incision was made.  Quad tendon was split between the medial one- third, lateral two-third.  Patella was  everted partially and cut from facet to facet removing 10 mm of bone.  Large spurs removed off both sides of the femur.  There is eburnated bone.  There was still some meniscal remnants present posteriorly on the lateral meniscus and medial meniscus, but bone-on-bone changes, and grade IV chondromalacia.   Spurs removed off both sides of the femur.  Computer pins were placed in the femur and inside the incision, and stab incision mid tibia.  Computer modules were generated, 9 mm were taken off the femur and 9 mm off the tibia.  Computer modules showed the patient had hyperextension of 3 degrees and valgus of 8 degrees.  Chamfer cuts were made.  There was 0.25-mm anterior notching, which was smoothed with the saw blade. Verification was performed with computer, #3 sizing for the tibia, gave good cortical rim contact.  Keel preparation of the patella.  Pulse lavage, vacuum cement mixing.  Tibia was cemented first after 3/4 curved osteotome was used to remove large posterior spurs off the femur.  The PCL was completely resected.  The femur was cemented next followed by placement of the poly and then cemented the patella.  Cement was hard at 15 minutes.  Tourniquet deflated.  Hemostasis obtained.  Computer pins were removed.  Subcu was closed with 2-0 Vicryl, and the stab incisions mid tibia.  Deep closure with #1 absorbable figure-of-eight sutures, 2-0 Vicryl in the subcutaneous tissue, superficial retinaculum, subcuticular skin closure, postop dressing, and knee immobilizer.  Instrument count and needle counts were correct.     Mark C. Ophelia Charter, M.D.     MCY/MEDQ  D:  02/05/2013  T:  02/05/2013  Job:  [id number]

## 2013-02-05 NOTE — Progress Notes (Signed)
Orthopedic Tech Progress Note Patient Details:  [name] 1950-05-01 [id number]  CPM Left Knee CPM Left Knee: On Left Knee Flexion (Degrees): 60 Left Knee Extension (Degrees): 0 Additional Comments: foot roll   Shawnie Pons 02/05/2013, 10:46 AM

## 2013-02-05 NOTE — Anesthesia Procedure Notes (Addendum)
Procedure Name: Intubation Date/Time: 02/05/2013 7:32 AM Performed by: Lanell Matar Pre-anesthesia Checklist: Patient identified, Emergency Drugs available, Suction available, Patient being monitored and Timeout performed Patient Re-evaluated:Patient Re-evaluated prior to inductionOxygen Delivery Method: Circle system utilized Preoxygenation: Pre-oxygenation with 100% oxygen Intubation Type: IV induction Ventilation: Mask ventilation without difficulty Laryngoscope Size: Miller and 2 Grade View: Grade I Tube type: Oral Tube size: 7.0 mm Number of attempts: 1 Airway Equipment and Method: Stylet Placement Confirmation: ETT inserted through vocal cords under direct vision,  positive ETCO2,  CO2 detector and breath sounds checked- equal and bilateral Secured at: 23 cm Tube secured with: Tape Dental Injury: Teeth and Oropharynx as per pre-operative assessment    Anesthesia Regional Block:  Femoral nerve block  Pre-Anesthetic Checklist: ,, timeout performed, Correct Patient, Correct Site, Correct Laterality, Correct Procedure, Correct Position, site marked, Risks and benefits discussed,  Surgical consent,  Pre-op evaluation,  At surgeon's request and post-op pain management  Laterality: Left  Prep: chloraprep and alcohol swabs       Needles:  Injection technique: Single-shot  Needle Type: Stimulator Needle - 80        Needle insertion depth: 7 cm   Additional Needles:  Procedures: nerve stimulator Femoral nerve block  Nerve Stimulator or Paresthesia:  Response: 0.5 mA, 0.1 ms, 7 cm  Additional Responses:   Narrative:  Start time: 02/05/2013 7:05 AM End time: 02/05/2013 7:10 AM Injection made incrementally with aspirations every 5 mL.  Performed by: Personally  Anesthesiologist: Maren Beach MD  Additional Notes: Pt accept procedure and risks. 20cc 0.5% Marcaine w/ epi w/o difficulty or discomfort. GES

## 2013-02-05 NOTE — Transfer of Care (Signed)
Immediate Anesthesia Transfer of Care Note  Patient: [name]  Procedure(s) Performed: Procedure(s) with comments: COMPUTER ASSISTED TOTAL KNEE ARTHROPLASTY- left (Left) - Left Cemented Total Knee Arthroplasty, Computer Assist  Patient Location: PACU  Anesthesia Type:General  Level of Consciousness: awake, alert  and oriented  Airway & Oxygen Therapy: Patient Spontanous Breathing and Patient connected to nasal cannula oxygen  Post-op Assessment: Report given to PACU RN, Post -op Vital signs reviewed and stable and Patient moving all extremities X 4  Post vital signs: Reviewed and stable  Complications: No apparent anesthesia complications

## 2013-02-06 LAB — BASIC METABOLIC PANEL
BUN: 9 mg/dL (ref 6–23)
Calcium: 8.4 mg/dL (ref 8.4–10.5)
Chloride: 99 mEq/L (ref 96–112)
GFR calc Af Amer: 71 mL/min — ABNORMAL LOW (ref >90)
GFR calc non Af Amer: 62 mL/min — ABNORMAL LOW (ref >90)
Potassium: 4.7 mEq/L (ref 3.5–5.1)
Sodium: 135 mEq/L (ref 135–145)

## 2013-02-06 LAB — CBC
HCT: 34.5 % — ABNORMAL LOW (ref 36.0–46.0)
Hemoglobin: 11.1 g/dL — ABNORMAL LOW (ref 12.0–15.0)
MCH: 30.2 pg (ref 26.0–34.0)
MCHC: 32.2 g/dL (ref 30.0–36.0)
Platelets: 199 10*3/uL (ref 150–400)
RDW: 13.8 % (ref 11.5–15.5)
WBC: 6.8 10*3/uL (ref 4.0–10.5)

## 2013-02-06 NOTE — Progress Notes (Signed)
   Subjective:  Patient reports pain as mild.  Feeling great  Objective:   VITALS:   Filed Vitals:   02/05/13 2126 02/06/13 0159 02/06/13 0553 02/06/13 1416  BP: 121/65 129/52 130/57 127/5  Pulse: 89 88 82 102  Temp: 98.9 F (37.2 C) 98.9 F (37.2 C) 97.8 F (36.6 C) 99.4 F (37.4 C)  TempSrc:    Oral  Resp: 18 18 18 20   SpO2: 99% 98% 99% 100%    Neurologically intact Neurovascular intact Sensation intact distally Intact pulses distally Dorsiflexion/Plantar flexion intact Incision: dressing C/D/I and no drainage No cellulitis present Compartment soft   Lab Results  Component Value Date   WBC 6.8 02/06/2013   HGB 11.1* 02/06/2013   HCT 34.5* 02/06/2013   MCV 94.0 02/06/2013   PLT 199 02/06/2013     Assessment/Plan: 1 Day Post-Op   Problem List Items Addressed This Visit   None      Advance diet Up with therapy Foley out today DVT ppx WBAT Pain control Discharge planning   Cheral Almas 02/06/2013, 8:46 PM [phone]

## 2013-02-06 NOTE — Progress Notes (Signed)
Physical Therapy Treatment Patient Details Name: [name] MRN: [id number] DOB: 05/09/1950 Today's Date: 02/06/2013 Time: 1215-1257 PT Time Calculation (min): 42 min  PT Assessment / Plan / Recommendation  History of Present Illness Patient is a 62 yo female s/p Lt TKA.   PT Comments   Pt fatigued very quickly during mobility. Pt ambulated to bathroom as she had to urinate.  Pt states her recliner at home is elevated and easier to get out of.  Pt will need to make significant progress with mobility in order to be able to DC home safely tomorrow from a mobility standpoint.  Pt breaking out in hives on  R arm and on her back. RN made aware.    Follow Up Recommendations        Does the patient have the potential to tolerate intense rehabilitation     Barriers to Discharge        Equipment Recommendations       Recommendations for Other Services    Frequency 7X/week   Progress towards PT Goals Progress towards PT goals: Progressing toward goals  Plan Current plan remains appropriate    Precautions / Restrictions Precautions Precautions: Knee;Fall Precaution Booklet Issued: Yes (comment) Required Braces or Orthoses: Knee Immobilizer - Left Knee Immobilizer - Left: On when out of bed or walking;Discontinue once straight leg raise with < 10 degree lag Restrictions Weight Bearing Restrictions: Yes LLE Weight Bearing: Weight bearing as tolerated   Pertinent Vitals/Pain Pt c/o pain on L knee superior and medial, did not give numerical rating.      Mobility  Bed Mobility Bed Mobility: Not assessed (pt up in chair upon PT's arrival) Transfers Transfers: Sit to Stand;Stand to Sit Sit to Stand: 3: Mod assist;From chair/3-in-1;With upper extremity assist (needed 2 people assist. performed twice. ) Stand to Sit: 4: Min assist;With armrests;To chair/3-in-1;Other (comment) (assist to let LLE out in front) Details for Transfer Assistance: Cues for  technique Ambulation/Gait Ambulation/Gait Assistance: 3: Mod assist Ambulation Distance (Feet): 10 Feet (also ambuated 6 feet out of bathroom) Assistive device: Rolling walker Ambulation/Gait Assistance Details: cues for sequesncing. Pt took multiple standing breaks when ambulating 10 feet due to fatigue.  Gait Pattern: Step-to pattern Gait velocity: very decreased General Gait Details: walker adjusted to better height for second ambulation attempt    Exercises Total Joint Exercises Ankle Circles/Pumps: AROM;Both;10 reps;Seated Quad Sets: AROM;Left;10 reps;Supine Hip ABduction/ADduction: AAROM;Left;10 reps;Supine Straight Leg Raises: AAROM;Left;10 reps;Supine Long Arc Quad: AAROM;Left;10 reps;Seated Knee Flexion: AAROM;Left;5 reps Goniometric ROM: 67 degrees flexion in sitting   PT Diagnosis:    PT Problem List:   PT Treatment Interventions:     PT Goals (current goals can now be found in the care plan section)    Visit Information  Last PT Received On: 02/06/13 Assistance Needed: +2 History of Present Illness: Patient is a 63 yo female s/p Lt TKA.    Subjective Data      Cognition  Cognition Arousal/Alertness: Awake/alert Behavior During Therapy: WFL for tasks assessed/performed Overall Cognitive Status: Within Functional Limits for tasks assessed    Balance     End of Session PT - End of Session Equipment Utilized During Treatment: Gait belt;Left knee immobilizer Activity Tolerance: Patient limited by fatigue Patient left: in chair;with call bell/phone within reach Nurse Communication: Mobility status   GP     Donnella Sham 02/06/2013, 3:47 PM Lavona Mound, PT  [phone] 02/06/2013

## 2013-02-06 NOTE — Progress Notes (Signed)
Orthopedic Tech Progress Note Patient Details:  [name] 1951-02-27 [id number]  Patient ID: [name], female   DOB: [birth year], 62 y.o.   MRN: [id number] Placed pt's lle in cpm @ 0-60 degrees @1945   [name] 02/06/2013, 7:37 PM

## 2013-02-06 NOTE — Progress Notes (Signed)
OT Cancellation Note and Discharge  Patient Details Name: [name] MRN: [id number] DOB: 26-Jan-1951   Cancelled Treatment:    Reason Eval/Treat Not Completed: Other (comment). Pt reports that she has all the DME/AE from a prior hip surgery and has family there to A her prn. No acute OT needs identified, will sign off.  Evette Georges [phone] 02/06/2013, 4:18 PM

## 2013-02-06 NOTE — Progress Notes (Signed)
Pt reports being in more pain this PM and not due for pain meds until 1630. Agreeable to work on walking with PT.  Did not attempt any exercises at end of session as OT present to work with pt. PT did much better with mobility in the PM session.    02/06/13 1550  PT Visit Information  Last PT Received On 02/06/13  Assistance Needed +2 (to follow with chair)  History of Present Illness Patient is a 62 yo female s/p Lt TKA.  PT Time Calculation  PT Start Time 1510  PT Stop Time 1525  PT Time Calculation (min) 15 min  Precautions  Precautions Knee;Fall  Precaution Booklet Issued Yes (comment)  Required Braces or Orthoses Knee Immobilizer - Left  Knee Immobilizer - Left On when out of bed or walking;Discontinue once straight leg raise with < 10 degree lag  Restrictions  Weight Bearing Restrictions Yes  LLE Weight Bearing WBAT  Cognition  Arousal/Alertness Awake/alert  Behavior During Therapy WFL for tasks assessed/performed  Overall Cognitive Status Within Functional Limits for tasks assessed  Bed Mobility  Bed Mobility Not assessed (pt up in chair upon PT's arrival)  Transfers  Transfers Sit to Stand;Stand to Sit  Sit to Stand From chair/3-in-1;With upper extremity assist;4: Min assist (needed 2 people assist. performed twice. )  Stand to Sit 4: Min assist;With armrests;To chair/3-in-1;Other (comment) (assist to let LLE out in front)  Details for Transfer Assistance Cues for technique  Ambulation/Gait  Ambulation/Gait Assistance 4: Min assist  Ambulation Distance (Feet) 20 Feet (ambulated 20 feet twice)  Assistive device Rolling walker  Gait Pattern Step-to pattern  Gait velocity decreased, much improved from the AM  General Gait Details pt issued bariatric RW to use in the hospital  PT - End of Session  Equipment Utilized During Treatment Gait belt;Left knee immobilizer  Activity Tolerance Patient tolerated treatment well  Patient left in chair;with call bell/phone within  reach;Other (comment) (OT present and here to work with pt)  PT - Assessment/Plan  PT Plan Current plan remains appropriate  PT Frequency 7X/week  PT General Charges  $$ ACUTE PT VISIT 1 Procedure  PT Treatments  $Gait Training 8-22 mins  [place], [place]  [phone] 02/06/2013

## 2013-02-07 LAB — CBC
Hemoglobin: 10.6 g/dL — ABNORMAL LOW (ref 12.0–15.0)
MCHC: 33 g/dL (ref 30.0–36.0)
RBC: 3.46 MIL/uL — ABNORMAL LOW (ref 3.87–5.11)

## 2013-02-07 NOTE — Progress Notes (Signed)
   Subjective:  Patient reports pain as mild.    Objective:   VITALS:   Filed Vitals:   02/06/13 1416 02/06/13 2142 02/06/13 2215 02/07/13 0659  BP: 127/5 123/108 118/58 138/59  Pulse: 102 101  83  Temp: 99.4 F (37.4 C) 98.5 F (36.9 C)  98.3 F (36.8 C)  TempSrc: Oral Oral  Oral  Resp: 20 18  16   SpO2: 100% 95%  97%    Neurologically intact Neurovascular intact Sensation intact distally Intact pulses distally Dorsiflexion/Plantar flexion intact Incision: dressing C/D/I and no drainage No cellulitis present Compartment soft   Lab Results  Component Value Date   WBC 6.2 02/07/2013   HGB 10.6* 02/07/2013   HCT 32.1* 02/07/2013   MCV 92.8 02/07/2013   PLT 188 02/07/2013     Assessment/Plan: 2 Days Post-Op   Problem List Items Addressed This Visit   None      Up with therapy WBAT DVT ppx - asa Dressings changed Pain control Discharge planning   Cheral Almas 02/07/2013, 10:41 AM [phone]

## 2013-02-07 NOTE — Progress Notes (Signed)
Physical Therapy Treatment Patient Details Name: ANNASTASIA HASKINS MRN: [id number] DOB: 26-Oct-1950 Today's Date: 02/07/2013 Time: 4098-1191 PT Time Calculation (min): 30 min  PT Assessment / Plan / Recommendation  History of Present Illness Patient is a 62 yo female s/p Lt TKA.   PT Comments   Continuing progress with mobility and distance ambulating; On track for potential dc home tomorrow  Follow Up Recommendations  Home health PT;Supervision/Assistance - 24 hour     Does the patient have the potential to tolerate intense rehabilitation     Barriers to Discharge        Equipment Recommendations  None recommended by PT    Recommendations for Other Services    Frequency 7X/week   Progress towards PT Goals Progress towards PT goals: Progressing toward goals  Plan Current plan remains appropriate    Precautions / Restrictions Precautions Precautions: Knee;Fall Precaution Booklet Issued: Yes (comment) Required Braces or Orthoses: Knee Immobilizer - Left Knee Immobilizer - Left: On when out of bed or walking;Discontinue once straight leg raise with < 10 degree lag Restrictions Weight Bearing Restrictions: Yes LLE Weight Bearing: Weight bearing as tolerated   Pertinent Vitals/Pain 5/10 L knee pain; was premedicated; patient repositioned for comfort     Mobility  Bed Mobility Bed Mobility: Sit to Supine Sit to Supine: 4: Min assist Details for Bed Mobility Assistance: Cues for technique; min assist for LLE Transfers Transfers: Sit to Stand;Stand to Sit Sit to Stand: From chair/3-in-1;With upper extremity assist;4: Min guard;With armrests Stand to Sit: With armrests;To chair/3-in-1;Other (comment);4: Min guard Details for Transfer Assistance: Cues for technique Ambulation/Gait Ambulation/Gait Assistance: 4: Min guard Ambulation Distance (Feet): 120 Feet Assistive device: Rolling walker Ambulation/Gait Assistance Details: Cues to activate quad for L stance stability;  Attempted some amb without KI; while no overt buckling noted, pt seems much more confident with KI Gait Pattern: Step-to pattern    Exercises     PT Diagnosis:    PT Problem List:   PT Treatment Interventions:     PT Goals (current goals can now be found in the care plan section) Acute Rehab PT Goals Patient Stated Goal: to return home PT Goal Formulation: With patient/family Time For Goal Achievement: 02/12/13 Potential to Achieve Goals: Good  Visit Information  Last PT Received On: 02/07/13 Assistance Needed: +1 History of Present Illness: Patient is a 62 yo female s/p Lt TKA.    Subjective Data  Subjective: was premedicated for pain Patient Stated Goal: to return home   Cognition  Cognition Arousal/Alertness: Awake/alert Behavior During Therapy: WFL for tasks assessed/performed Overall Cognitive Status: Within Functional Limits for tasks assessed    Balance     End of Session PT - End of Session Equipment Utilized During Treatment: Left knee immobilizer Activity Tolerance: Patient tolerated treatment well Patient left: with call bell/phone within reach;in bed Nurse Communication: Mobility status CPM Left Knee CPM Left Knee: On   GP    Van Clines, PT [phone]  Van Clines [facility] 02/07/2013, 5:36 PM

## 2013-02-07 NOTE — Progress Notes (Signed)
Physical Therapy Treatment Patient Details Name: [name] MRN: [id number] DOB: 1950-10-17 Today's Date: 02/07/2013 Time: 4098-1191 PT Time Calculation (min): 35 min  PT Assessment / Plan / Recommendation  History of Present Illness Patient is a 62 yo female s/p Lt TKA.   PT Comments   Making steady progress with functional mobility; Will likely be able to dchome tomorrow from a PT standpoint   Follow Up Recommendations  Home health PT;Supervision/Assistance - 24 hour     Does the patient have the potential to tolerate intense rehabilitation     Barriers to Discharge        Equipment Recommendations  None recommended by PT    Recommendations for Other Services    Frequency 7X/week   Progress towards PT Goals Progress towards PT goals: Progressing toward goals  Plan Current plan remains appropriate    Precautions / Restrictions Precautions Precautions: Knee;Fall Precaution Booklet Issued: Yes (comment) Required Braces or Orthoses: Knee Immobilizer - Left Knee Immobilizer - Left: On when out of bed or walking;Discontinue once straight leg raise with < 10 degree lag Restrictions Weight Bearing Restrictions: Yes LLE Weight Bearing: Weight bearing as tolerated   Pertinent Vitals/Pain "tolerable"; had received pain meds patient repositioned for comfort and optimal knee ext     Mobility  Bed Mobility Bed Mobility: Not assessed (pt up in chair upon PT's arrival) Transfers Transfers: Sit to Stand;Stand to Sit Sit to Stand: From chair/3-in-1;With upper extremity assist;4: Min guard;With armrests Stand to Sit: With armrests;To chair/3-in-1;Other (comment);4: Min guard Details for Transfer Assistance: Cues for technique Ambulation/Gait Ambulation/Gait Assistance: 4: Min guard Ambulation Distance (Feet): 75 Feet Assistive device: Rolling walker Ambulation/Gait Assistance Details: Cues for posture, and to activate L quad for stance stability Gait Pattern: Step-to  pattern General Gait Details: pt issued bariatric RW to use in the hospital    Exercises Total Joint Exercises Quad Sets: AROM;Left;20 reps Heel Slides: AAROM;Left;10 reps Straight Leg Raises: AAROM;Left;10 reps   PT Diagnosis:    PT Problem List:   PT Treatment Interventions:     PT Goals (current goals can now be found in the care plan section) Acute Rehab PT Goals Patient Stated Goal: to return home PT Goal Formulation: With patient/family Time For Goal Achievement: 02/12/13 Potential to Achieve Goals: Good  Visit Information  Last PT Received On: 02/07/13 Assistance Needed: +2 (to follow with chair) History of Present Illness: Patient is a 62 yo female s/p Lt TKA.    Subjective Data  Subjective: Reportsgetting OOB without pain meds was excrutiating Patient Stated Goal: to return home   Cognition  Cognition Arousal/Alertness: Awake/alert Behavior During Therapy: WFL for tasks assessed/performed Overall Cognitive Status: Within Functional Limits for tasks assessed    Balance     End of Session PT - End of Session Equipment Utilized During Treatment: Left knee immobilizer Activity Tolerance: Patient tolerated treatment well Patient left: in chair;with call bell/phone within reach Nurse Communication: Mobility status   GP     Van Clines [facility] [place], [place] [phone]  02/07/2013, 12:31 PM

## 2013-02-08 ENCOUNTER — Encounter (HOSPITAL_COMMUNITY): Payer: Self-pay | Admitting: Orthopaedic Surgery

## 2013-02-08 LAB — CBC
HCT: 29.9 % — ABNORMAL LOW (ref 36.0–46.0)
MCV: 92.9 fL (ref 78.0–100.0)
Platelets: 207 10*3/uL (ref 150–400)
RBC: 3.22 MIL/uL — ABNORMAL LOW (ref 3.87–5.11)
WBC: 6.4 10*3/uL (ref 4.0–10.5)

## 2013-02-08 NOTE — Progress Notes (Signed)
PT Cancellation Note  Patient Details Name: [name] MRN: [id number] DOB: 11-19-1950   Cancelled Treatment:     Pt deferring 2nd PT session due she's getting ready to go home & states there's nothing else she needs to review at this time.      Verdell Face, [place] [phone] 02/08/2013

## 2013-02-08 NOTE — Progress Notes (Signed)
02/08/13 Spoke with patient about HHC, she chose [facility]. Contacted [facility] at [facility] and set up HHPT. Patient stated that she has a rolling walker but needs larger size 3N1. Contacted Darian with [facility] and requested bariatric 3N1 to be delivered to room prior to d/c. Jacquelynn Cree RN, BSN, CCM

## 2013-02-08 NOTE — Progress Notes (Signed)
Subjective: 3 Days Post-Op Procedure(s) (LRB): COMPUTER ASSISTED TOTAL KNEE ARTHROPLASTY- left (Left) Patient reports pain as mild.    Objective: Vital signs in last 24 hours: Temp:  [98 F (36.7 C)-98.4 F (36.9 C)] 98 F (36.7 C) (10/27 0500) Pulse Rate:  [98-106] 98 (10/27 0500) Resp:  [18] 18 (10/27 0500) BP: (114-127)/(51-58) 114/53 mmHg (10/27 0500) SpO2:  [95 %-97 %] 95 % (10/27 0500)  Intake/Output from previous day: 10/26 0701 - 10/27 0700 In: 800 [P.O.:800] Out: -  Intake/Output this shift:     Recent Labs  02/06/13 0527 02/07/13 0556 02/08/13 0545  HGB 11.1* 10.6* 9.9*    Recent Labs  02/07/13 0556 02/08/13 0545  WBC 6.2 PENDING  RBC 3.46* 3.22*  HCT 32.1* 29.9*  PLT 188 207    Recent Labs  02/06/13 0527  NA 135  K 4.7  CL 99  CO2 26  BUN 9  CREATININE 0.96  GLUCOSE 139*  CALCIUM 8.4   No results found for this basename: LABPT, INR,  in the last 72 hours  Neurovascular intact Intact pulses distally Dorsiflexion/Plantar flexion intact Incision: no drainage  Assessment/Plan: 3 Days Post-Op Procedure(s) (LRB): COMPUTER ASSISTED TOTAL KNEE ARTHROPLASTY- left (Left) Discharge home with home health  Minh Jasper M 02/08/2013, 10:06 AM

## 2013-02-08 NOTE — Progress Notes (Signed)
Physical Therapy Treatment Patient Details Name: [name] MRN: [id number] DOB: [birth year] Today's Date: 02/08/2013 Time: 0930-1000 PT Time Calculation (min): 30 min  PT Assessment / Plan / Recommendation  History of Present Illness Patient is a 62 yo female s/p Lt TKA.   PT Comments   Continuing progress, especially with bed mobiltiy and transfers; OK for dc home from PT standpoint  Follow Up Recommendations  Home health PT;Supervision/Assistance - 24 hour     Does the patient have the potential to tolerate intense rehabilitation     Barriers to Discharge        Equipment Recommendations  None recommended by PT    Recommendations for Other Services    Frequency 7X/week   Progress towards PT Goals Progress towards PT goals: Progressing toward goals  Plan Current plan remains appropriate    Precautions / Restrictions Precautions Precautions: Knee Precaution Booklet Issued: Yes (comment) Required Braces or Orthoses: Knee Immobilizer - Left Knee Immobilizer - Left: On when out of bed or walking;Discontinue once straight leg raise with < 10 degree lag Restrictions Weight Bearing Restrictions: Yes LLE Weight Bearing: Weight bearing as tolerated   Pertinent Vitals/Pain 6/10 post amb; RN notified    Mobility  Bed Mobility Bed Mobility: Supine to Sit;Sitting - Scoot to Edge of Bed Supine to Sit: 5: Supervision Sitting - Scoot to Edge of Bed: 5: Supervision Details for Bed Mobility Assistance: Cues for technique Transfers Transfers: Sit to Stand;Stand to Sit Sit to Stand: From chair/3-in-1;With upper extremity assist;4: Min guard;With armrests;From bed Stand to Sit: With armrests;To chair/3-in-1;Other (comment);4: Min guard Details for Transfer Assistance: Cues for technique Ambulation/Gait Ambulation/Gait Assistance: 5: Supervision Ambulation Distance (Feet): 80 Feet Assistive device: Rolling walker Ambulation/Gait Assistance Details: Cues to self-monitor for  dizziness/activity tolerance; noted some dyspnea during amb today Gait Pattern: Step-to pattern    Exercises Total Joint Exercises Ankle Circles/Pumps: AROM;Both;10 reps Quad Sets: AROM;Left;10 reps Heel Slides: AAROM;Left;10 reps Straight Leg Raises: AAROM;Left;10 reps   PT Diagnosis:    PT Problem List:   PT Treatment Interventions:     PT Goals (current goals can now be found in the care plan section) Acute Rehab PT Goals Patient Stated Goal: to return home PT Goal Formulation: With patient/family Time For Goal Achievement: 02/12/13 Potential to Achieve Goals: Good  Visit Information  Last PT Received On: 02/08/13 Assistance Needed: +1 History of Present Illness: Patient is a 62 yo female s/p Lt TKA.    Subjective Data  Subjective: Hoping to go home today Patient Stated Goal: to return home   Cognition  Cognition Arousal/Alertness: Awake/alert Behavior During Therapy: WFL for tasks assessed/performed Overall Cognitive Status: Within Functional Limits for tasks assessed    Balance     End of Session PT - End of Session Equipment Utilized During Treatment: Left knee immobilizer Activity Tolerance: Patient tolerated treatment well Patient left: with call bell/phone within reach;in chair Nurse Communication: Mobility status   GP     Van Clines [facility] [place], [place] [phone]  02/08/2013, 10:35 AM

## 2013-02-09 NOTE — Discharge Summary (Signed)
Physician Discharge Summary  Patient ID: [name] MRN: [id number] DOB/AGE: 1950/07/09 62 y.o.  Admit date: 02/05/2013 Discharge date: 02/08/2013  Admission Diagnoses:  Osteoarthritis of left knee  Discharge Diagnoses:  Principal Problem:   Osteoarthritis of left knee   Past Medical History  Diagnosis Date  . Asthma   . Bronchitis   . Pneumonia   . Anemia   . Chronic kidney disease     urinary tract infection  . GERD (gastroesophageal reflux disease)   . H/O hiatal hernia   . Headache(784.0)   . Arthritis     osteoarthritis  . Anxiety     xanax, 2 tabs per day  . Incontinence     wears pad    Surgeries: Procedure(s): COMPUTER ASSISTED TOTAL KNEE ARTHROPLASTY- left on 02/05/2013   Consultants (if any):  none  Discharged Condition: Improved  Hospital Course: [name] is an 62 y.o. female who was admitted 02/05/2013 with a diagnosis of Osteoarthritis of left knee and went to the operating room on 02/05/2013 and underwent the above named procedures.    She was given perioperative antibiotics:  Anti-infectives   Start     Dose/Rate Route Frequency Ordered Stop   02/05/13 1330  ceFAZolin (ANCEF) IVPB 2 g/50 mL premix     2 g 100 mL/hr over 30 Minutes Intravenous Every 6 hours 02/05/13 1133 02/05/13 1346   02/05/13 0600  ceFAZolin (ANCEF) 3 g in dextrose 5 % 50 mL IVPB     3 g 160 mL/hr over 30 Minutes Intravenous On call to O.R. 02/04/13 1409 02/05/13 0735    .  She was given sequential compression devices, early ambulation, and aspirin for DVT prophylaxis.  She benefited maximally from the hospital stay and there were no complications.    Recent vital signs:  Filed Vitals:   02/08/13 0500  BP: 114/53  Pulse: 98  Temp: 98 F (36.7 C)  Resp: 18    Recent laboratory studies:  Lab Results  Component Value Date   HGB 9.9* 02/08/2013   HGB 10.6* 02/07/2013   HGB 11.1* 02/06/2013   Lab Results  Component Value Date   WBC 6.4 02/08/2013    PLT 207 02/08/2013   Lab Results  Component Value Date   INR 0.92 01/27/2013   Lab Results  Component Value Date   NA 135 02/06/2013   K 4.7 02/06/2013   CL 99 02/06/2013   CO2 26 02/06/2013   BUN 9 02/06/2013   CREATININE 0.96 02/06/2013   GLUCOSE 139* 02/06/2013    Discharge Medications:     Medication List    STOP taking these medications       CIPRO 500 MG tablet  Generic drug:  ciprofloxacin      TAKE these medications       albuterol 108 (90 BASE) MCG/ACT inhaler  Commonly known as:  PROVENTIL HFA;VENTOLIN HFA  Inhale 1-2 puffs into the lungs every 6 (six) hours as needed for wheezing.     ALPRAZolam 0.5 MG 24 hr tablet  Commonly known as:  XANAX XR  Take 0.5-1 mg by mouth at bedtime as needed (for anxiety).     aspirin EC 325 MG tablet  Take 1 tablet (325 mg total) by mouth daily.     CALCIUM 600 + D PO  Take 1 tablet by mouth daily.     cetirizine 10 MG tablet  Commonly known as:  ZYRTEC  Take 10 mg by mouth daily as needed for  allergies.     diclofenac 75 MG EC tablet  Commonly known as:  VOLTAREN  Take 75 mg by mouth 2 (two) times daily as needed (for pain).     esomeprazole 40 MG capsule  Commonly known as:  NEXIUM  Take 40 mg by mouth daily before breakfast.     ibuprofen 200 MG tablet  Commonly known as:  ADVIL,MOTRIN  Take 800 mg by mouth daily as needed for pain.     Iron 325 (65 FE) MG Tabs  Take 325 mg by mouth daily.     methocarbamol 500 MG tablet  Commonly known as:  ROBAXIN  Take 1 tablet (500 mg total) by mouth every 6 (six) hours as needed (spasm).     oxyCODONE-acetaminophen 5-325 MG per tablet  Commonly known as:  ROXICET  Take 1-2 tablets by mouth every 4 (four) hours as needed for pain.     pravastatin 40 MG tablet  Commonly known as:  PRAVACHOL  Take 40 mg by mouth at bedtime.     torsemide 20 MG tablet  Commonly known as:  DEMADEX  Take 20 mg by mouth daily.        Diagnostic Studies: Dg Chest 2  View  01/27/2013   CLINICAL DATA:  Hypertension.  EXAM: CHEST  2 VIEW  COMPARISON:  April 25, 2011.  FINDINGS: The heart size and mediastinal contours are within normal limits. Both lungs are clear. The visualized skeletal structures are unremarkable.  IMPRESSION: No active cardiopulmonary disease.   Electronically Signed   By: Roque Lias M.D.   On: 01/27/2013 15:07    Disposition: 06-Home-Health Care Svc      Discharge Orders   Future Orders Complete By Expires   Call MD / Call 911  As directed    Comments:     If you experience chest pain or shortness of breath, CALL 911 and be transported to the hospital emergency room.  If you develope a fever above 101 F, pus (white drainage) or increased drainage or redness at the wound, or calf pain, call your surgeon's office.   Constipation Prevention  As directed    Comments:     Drink plenty of fluids.  Prune juice may be helpful.  You may use a stool softener, such as Colace (over the counter) 100 mg twice a day.  Use MiraLax (over the counter) for constipation as needed.   Diet - low sodium heart healthy  As directed    Discharge instructions  As directed    Comments:     Keep knee incision dry for 5 days post op then may wet while bathing. Therapy daily and CPM goal full extension and greater than 90 degrees flexion. Call if fever or chills or increased drainage. Go to ER if acutely short of breath or call for ambulance. Return for follow up in 2 weeks. May full weight bear on the surgical leg unless told otherwise. Use knee immobilizer until able to straight leg raise off bed with knee stable. In house walking for first 2 weeks.   Increase activity slowly as tolerated  As directed       Follow-up Information   Follow up with YATES,MARK C, MD. Schedule an appointment as soon as possible for a visit in 2 weeks.   Specialty:  Orthopedic Surgery   Contact information:   [address] [place] [address] [phone]         Signed: Wende Neighbors 02/09/2013, 2:53 PM

## 2016-02-23 ENCOUNTER — Encounter (INDEPENDENT_AMBULATORY_CARE_PROVIDER_SITE_OTHER): Payer: Self-pay | Admitting: Orthopaedic Surgery

## 2016-02-23 ENCOUNTER — Ambulatory Visit (INDEPENDENT_AMBULATORY_CARE_PROVIDER_SITE_OTHER): Payer: Self-pay

## 2016-02-23 ENCOUNTER — Ambulatory Visit (INDEPENDENT_AMBULATORY_CARE_PROVIDER_SITE_OTHER): Payer: 59 | Admitting: Orthopaedic Surgery

## 2016-02-23 VITALS — BP 112/74 | HR 83 | Ht 67.0 in | Wt 340.0 lb

## 2016-02-23 DIAGNOSIS — M25561 Pain in right knee: Secondary | ICD-10-CM

## 2016-02-23 DIAGNOSIS — M1711 Unilateral primary osteoarthritis, right knee: Secondary | ICD-10-CM

## 2016-02-23 DIAGNOSIS — G8929 Other chronic pain: Secondary | ICD-10-CM

## 2016-02-23 NOTE — Progress Notes (Signed)
Office Visit Note   Patient: [name]           Date of Birth: 04/22/1950           MRN: [id number] Visit Date: 02/23/2016              Requested by: No referring provider defined for this encounter. PCP:    Assessment & Plan: Visit Diagnoses:  1. Chronic pain of right knee   2. Unilateral primary osteoarthritis, right knee     Plan: Patient's had increased pain in her right knee left total knee arthroplasty doing well. She is not able to exercise due to her pain she's had repetitive cortisone injections. Pulses are daily basis. Patient states she liked proceed next year with a total knee arthroplasty she is continuing to try the diet. Patient proceeded right total knee arthroplasty in February. She'll make arrangements for someone to stay with her and she had when she had previous joint arthroplasty. Follow-Up Instructions: No Follow-up on file.   Orders:  No orders of the defined types were placed in this encounter.  No orders of the defined types were placed in this encounter.     Procedures: No procedures performed   Clinical Data: No additional findings.  Outside x-rays from the orthopedic surgery clinic standing x-rays show bone-on-bone changes right knee marginal osteophytes 2 cm. Subchondral sclerosis flattening the femoral condyles. Davis left total knee arthroplasty doing well with good position alignment and no loosening. Subjective: Chief Complaint  Patient presents with  . Right Knee - Pain    Ms. [name] is here for her right knee.  States that it is swelling a lot and has been in a lot of pain.  The cold weather is making it unbearable.  States [facility] She states that she was given an injection in her knee, and gave her a dose pak to take.  This did help for a while however it is coming back again now. She states that she is having popping, and just below patella she was having a sensation that something was  wanting to shift and giveaway on her.  States that the leg from just above the knee wants to swell down to the foot.     Review of Systems the assistant positive for right anxiety acid reflux knee arthritis ~proctoplasty on the right the significant left hip osteoarthritis. Previous left total knee arthroplasty doing well. Bladder problems with incontinence bronchitis increased cholesterol osteopenia. Previous surgeries include right total hip arthroplasty and appendectomy right left ankle fractures ORIF left wrist ORIF hysterectomy partial. Increased cholesterol. Negative for MI or arrhythmias.   Objective: Vital Signs: BP 112/74 (BP Location: Left Arm, Patient Position: Sitting)   Pulse 83   Ht 5\' 7"  (1.702 m)   Wt (!) 340 lb (154.2 kg)   BMI 53.25 kg/m   Physical Exam  Constitutional: She is oriented to person, place, and time. She appears well-developed.  HENT:  Head: Normocephalic.  Right Ear: External ear normal.  Left Ear: External ear normal.  Eyes: Pupils are equal, round, and reactive to light.  Neck: No tracheal deviation present. No thyromegaly present.  Cardiovascular: Normal rate.   Pulmonary/Chest: Effort normal.  Abdominal: Soft.  Truncal obesity. She has some erythema in the skin folds without the white exudate consistent with some skin irritation.  Musculoskeletal:  Well-healed the right posterior hip incision left anterior total knee arthroplasty incision without the knee  effusion. Collateral ligaments are stable. Right knee shows severe crepitus she has 5 lacking extension flexes to 95 with significant crepitus. Palpable tender osteophytes and present knee effusion.  Neurological: She is alert and oriented to person, place, and time.  Skin: Skin is warm and dry.  Psychiatric: She has a normal mood and affect. Her behavior is normal.    Ortho Exam deformity or joint effusion is noted in the fingers or hands good grip. I was reach full extension good rotator cuff  strength good cervical range of motion. Pelvis is level open with right hip range of motion left hip has 10 internal rotation 20 external rotation with pain. Right knee has crepitus distal pulses are palpable anterior tib EHL is intact.  Specialty Comments:  No specialty comments available.  Imaging: No results found.   PMFS History: Patient Active Problem List   Diagnosis Date Noted  . Chronic pain of right knee 02/23/2016  . Unilateral primary osteoarthritis, right knee 02/05/2013    Class: Diagnosis of  . 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 Anemia   . Anxiety    xanax, 2 tabs per day  . Arthritis    osteoarthritis  . Asthma   . Bronchitis   . Chronic kidney disease    urinary tract infection  . GERD (gastroesophageal reflux disease)   . H/O hiatal hernia   . Headache(784.0)   . Incontinence    wears pad  . Pneumonia     Family History  Problem Relation Age of Onset  . Anesthesia problems Mother     Past Surgical History:  Procedure Laterality Date  . ABDOMINAL HYSTERECTOMY     partial  . ANKLE SURGERY     with plate on right and left  . APPENDECTOMY    . BUNIONECTOMY     right  . FRACTURE SURGERY     left wrist fracture repair  with plate  . HIP ARTHROPLASTY     right  . KNEE ARTHROPLASTY Left 02/05/2013   Procedure: COMPUTER ASSISTED TOTAL KNEE ARTHROPLASTY- left;  Surgeon: Eldred MangesMark C Ronna Herskowitz, MD;  Location: [facility] OR;  Service: Orthopedics;  Laterality: Left;  Left Cemented Total Knee Arthroplasty, Computer Assist  . TOTAL HIP REVISION  04/26/2011   Procedure: TOTAL HIP REVISION;  Surgeon: Eldred MangesMark C Coleby Yett;  Location: [facility] OR;  Service: Orthopedics;  Laterality: Right;  Revision Right Hip to Metal on Poly  . TUBAL LIGATION    . WRIST ARTHROSCOPY     x2  fell   Social History   Occupational History  . Not on file.   Social History Main Topics  . Smoking status: Former Smoker     Types: Cigarettes  . Smokeless tobacco: Not on file     Comment: only smoked 1 pack then quit  . Alcohol use Yes     Comment: occassional  . Drug use: No  . Sexual activity: Not on file

## 2017-08-26 ENCOUNTER — Ambulatory Visit (INDEPENDENT_AMBULATORY_CARE_PROVIDER_SITE_OTHER): Payer: 59 | Admitting: Orthopaedic Surgery
